# Patient Record
Sex: Male | Born: 1990 | Race: White | Hispanic: No | Marital: Married | Smoking: Never smoker
Health system: Southern US, Community
[De-identification: ages and names within clinical notes are randomized; demographics above are authoritative.]

## PROBLEM LIST (undated history)

## (undated) DIAGNOSIS — F909 Attention-deficit hyperactivity disorder, unspecified type: Secondary | ICD-10-CM

## (undated) DIAGNOSIS — F319 Bipolar disorder, unspecified: Secondary | ICD-10-CM

## (undated) DIAGNOSIS — F419 Anxiety disorder, unspecified: Secondary | ICD-10-CM

## (undated) HISTORY — PX: OTHER SURGICAL HISTORY: SHX169

## (undated) HISTORY — DX: Anxiety disorder, unspecified: F41.9

## (undated) HISTORY — DX: Bipolar disorder, unspecified: F31.9

## (undated) HISTORY — DX: Attention-deficit hyperactivity disorder, unspecified type: F90.9

---
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
## 2014-02-25 ENCOUNTER — Ambulatory Visit (INDEPENDENT_AMBULATORY_CARE_PROVIDER_SITE_OTHER): Payer: Self-pay | Admitting: Emergency Medicine

## 2014-02-25 VITALS — BP 110/68 | HR 58 | Temp 98.6°F | Resp 16 | Ht 69.0 in | Wt 123.0 lb

## 2014-02-25 DIAGNOSIS — Z0289 Encounter for other administrative examinations: Secondary | ICD-10-CM

## 2014-02-25 NOTE — Progress Notes (Signed)
[facility] [address], [place] [address] [phone]  Date:  02/25/2014   Name:  Michael OkaCurtis B Janssens   DOB:  [birth year]   MRN:  [id number]  PCP:  PROVIDER NOT IN SYSTEM    Chief Complaint: Employment Physical   History of Present Illness:  Michael OkaCurtis B Franson is a 23 y.o. very pleasant male patient who presents with the following:  DOT certification  There are no active problems to display for this patient.   No past medical history on file.  No past surgical history on file.  History  Substance Use Topics  . Smoking status: Never Smoker   . Smokeless tobacco: Not on file  . Alcohol Use: Not on file    No family history on file.  No Known Allergies  Medication list has been reviewed and updated.  No current outpatient prescriptions on file prior to visit.   No current facility-administered medications on file prior to visit.    Review of Systems:  As per HPI, otherwise negative.    Physical Examination: Filed Vitals:   02/25/14 1951  BP: 110/68  Pulse: 58  Temp: 98.6 F (37 C)  Resp: 16   Filed Vitals:   02/25/14 1951  Height: 5\' 9"  (1.753 m)  Weight: 123 lb (55.792 kg)   Body mass index is 18.16 kg/(m^2). Ideal Body Weight: Weight in (lb) to have BMI = 25: 168.9  GEN: WDWN, NAD, Non-toxic, A & O x 3 HEENT: Atraumatic, Normocephalic. Neck supple. No masses, No LAD. Ears and Nose: No external deformity. CV: RRR, No M/G/R. No JVD. No thrill. No extra heart sounds. PULM: CTA B, no wheezes, crackles, rhonchi. No retractions. No resp. distress. No accessory muscle use. ABD: S, NT, ND, +BS. No rebound. No HSM. EXTR: No c/c/e NEURO Normal gait.  PSYCH: Normally interactive. Conversant. Not depressed or anxious appearing.  Calm demeanor.     Assessment and Plan: DOT certification  Signed,  Phillips OdorJeffery Anderson, MD

## 2014-05-02 ENCOUNTER — Emergency Department: Payer: Self-pay | Admitting: Emergency Medicine

## 2014-09-17 IMAGING — CR CERVICAL SPINE - COMPLETE 4+ VIEW
1 series · 6 of 6 positions shown · non-contrast
Comparison: None.

CLINICAL DATA: Neck pain.

EXAM:
CERVICAL SPINE  4+ VIEWS

[Series 1: dxr cervical spine complete · 0.14mm/px · 6 of 6 slices shown]
[im 1/6]
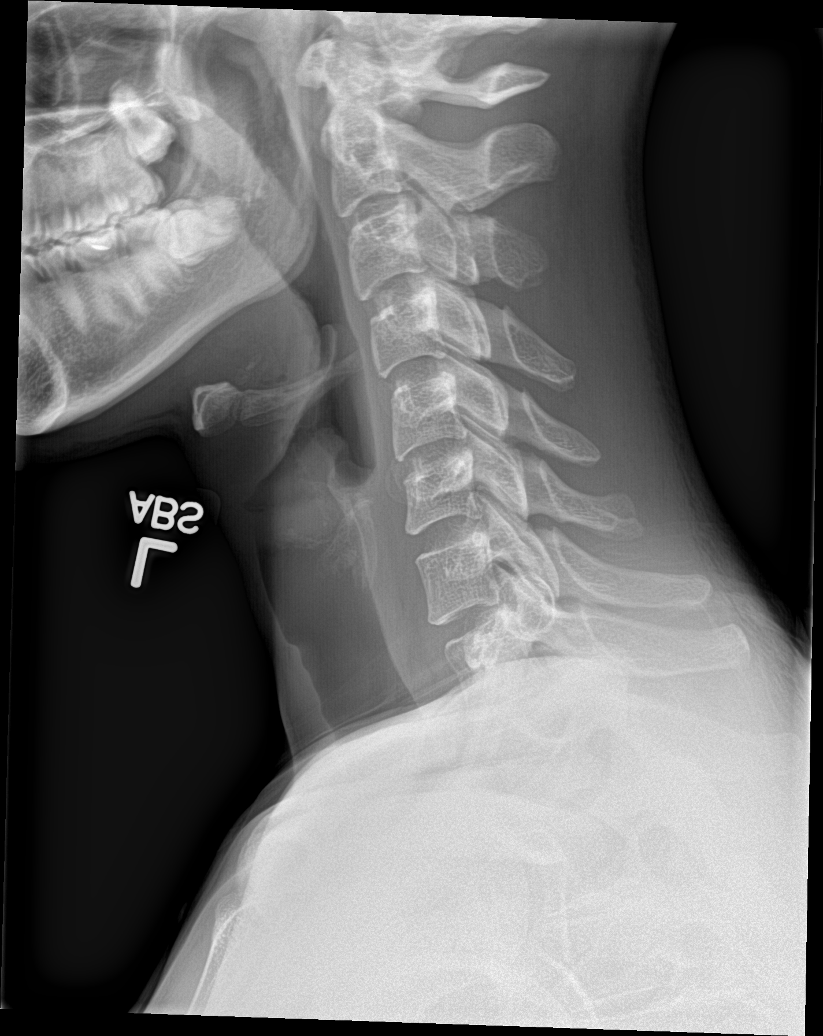
[im 2/6]
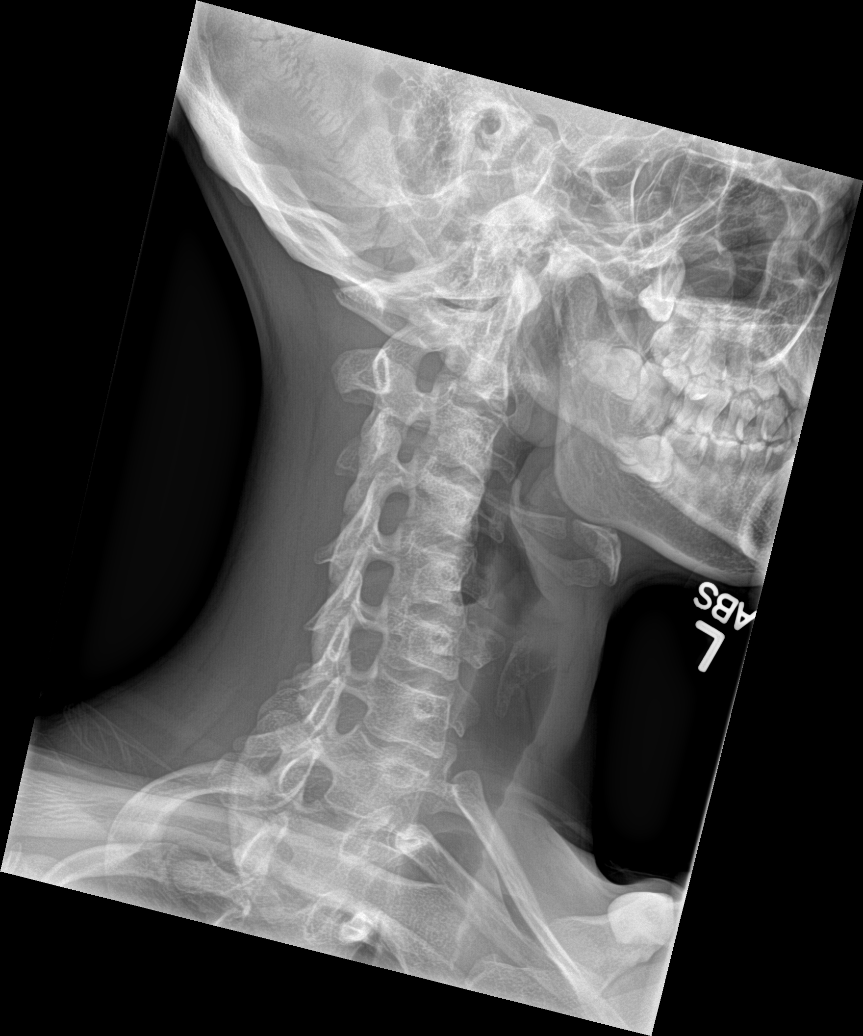
[im 3/6]
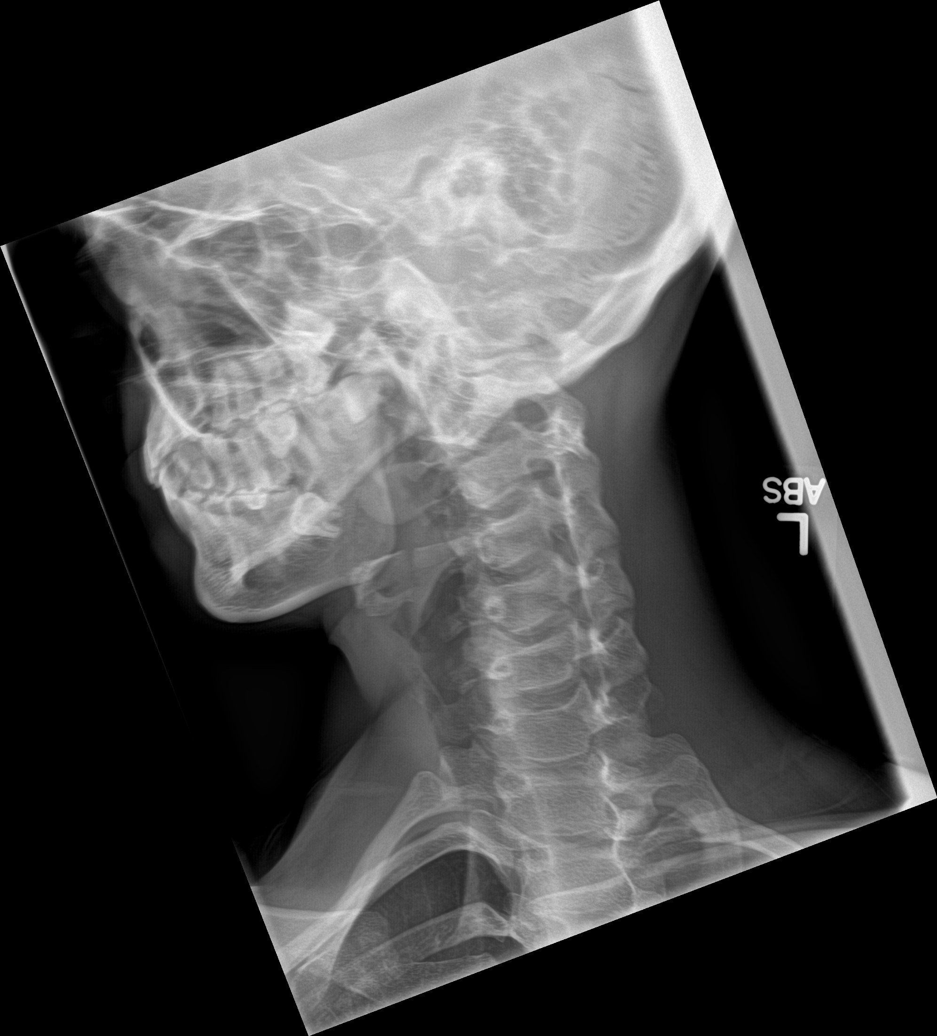
[im 4/6]
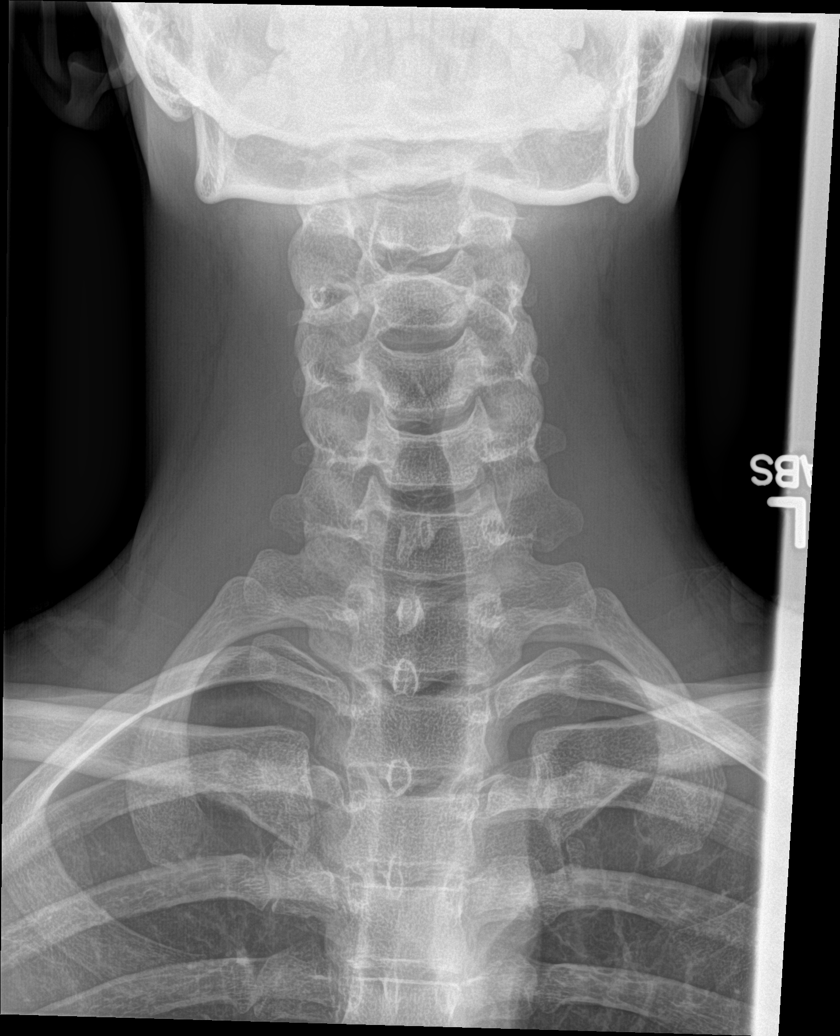
[im 5/6]
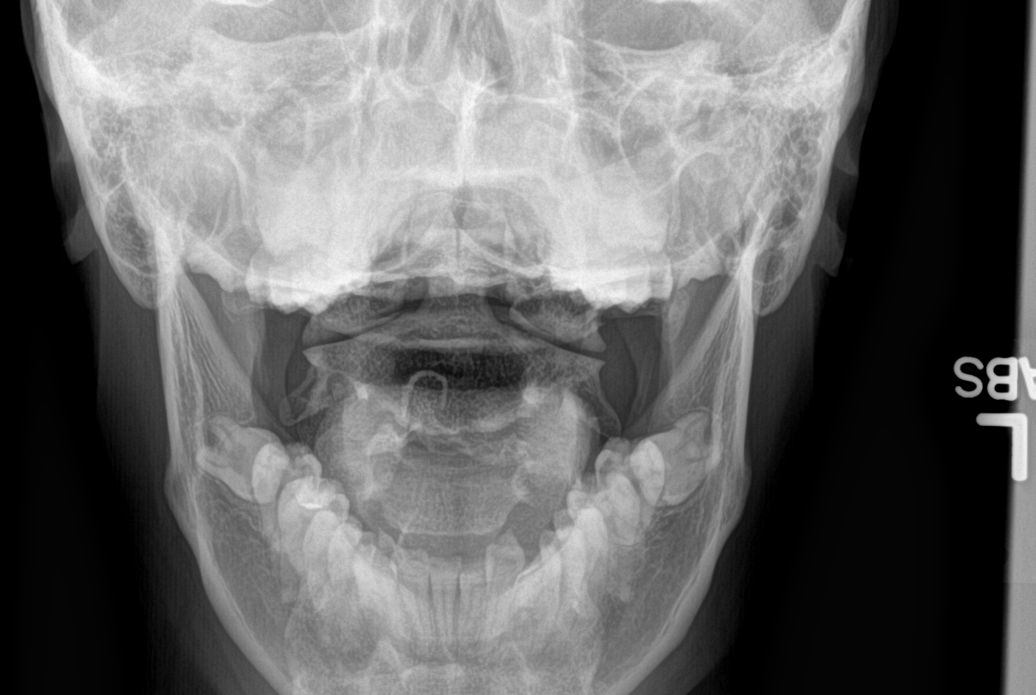
[im 6/6]
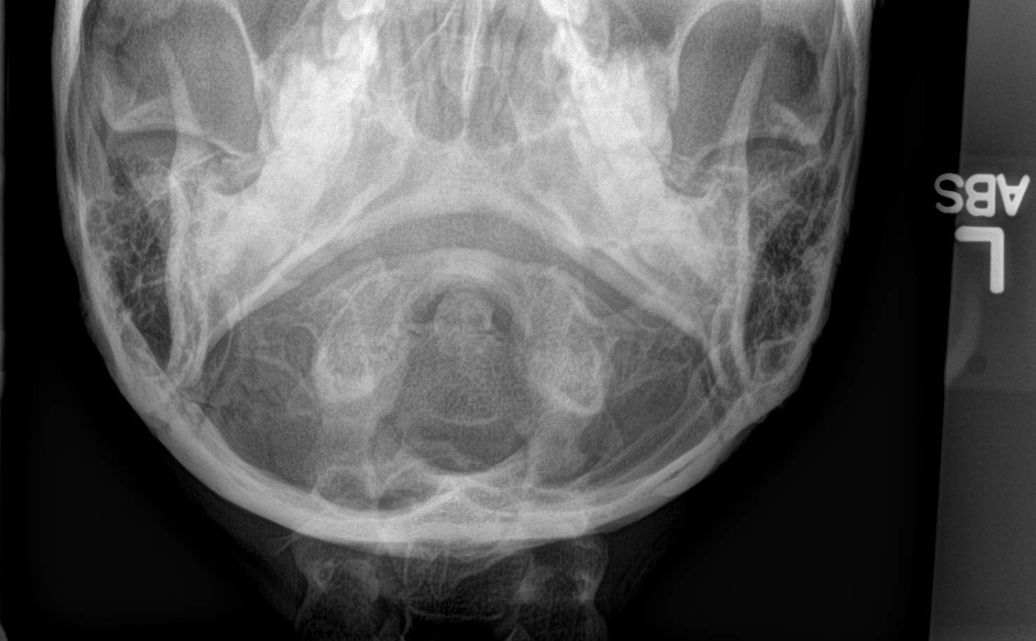

[6 of 6 positions shown; findings below may reference images not displayed]

FINDINGS: There is no evidence of cervical spine fracture or prevertebral soft
tissue swelling. Alignment is normal. No other significant bone
abnormalities are identified.
IMPRESSION: Negative cervical spine radiographs.

## 2019-07-20 ENCOUNTER — Other Ambulatory Visit: Payer: Self-pay

## 2019-07-20 DIAGNOSIS — Z20822 Contact with and (suspected) exposure to covid-19: Secondary | ICD-10-CM

## 2019-07-22 LAB — NOVEL CORONAVIRUS, NAA: SARS-CoV-2, NAA: NOT DETECTED

## 2019-10-01 ENCOUNTER — Ambulatory Visit: Payer: 59 | Attending: Internal Medicine

## 2019-10-01 DIAGNOSIS — Z20822 Contact with and (suspected) exposure to covid-19: Secondary | ICD-10-CM | POA: Insufficient documentation

## 2019-10-02 LAB — NOVEL CORONAVIRUS, NAA: SARS-CoV-2, NAA: NOT DETECTED

## 2019-10-06 DIAGNOSIS — Z20828 Contact with and (suspected) exposure to other viral communicable diseases: Secondary | ICD-10-CM | POA: Diagnosis not present

## 2019-10-06 DIAGNOSIS — Z03818 Encounter for observation for suspected exposure to other biological agents ruled out: Secondary | ICD-10-CM | POA: Diagnosis not present

## 2019-12-17 ENCOUNTER — Ambulatory Visit: Payer: 59 | Admitting: Internal Medicine

## 2019-12-17 ENCOUNTER — Encounter: Payer: Self-pay | Admitting: Internal Medicine

## 2019-12-17 ENCOUNTER — Other Ambulatory Visit: Payer: Self-pay

## 2019-12-17 VITALS — BP 128/62 | HR 77 | Ht 69.0 in | Wt 138.0 lb

## 2019-12-17 DIAGNOSIS — Z0000 Encounter for general adult medical examination without abnormal findings: Secondary | ICD-10-CM

## 2019-12-17 NOTE — Patient Instructions (Signed)

## 2019-12-17 NOTE — Progress Notes (Signed)
Date:  12/17/2019   Name:  Michael Bowen   DOB:  Jan 30, 1991   MRN:  [id number]   Chief Complaint: [place] patient to establish PCP. He has not had a check up in about 10 yrs.  He did have a Tdap in 2015 after a traumatic thumb injury.  He has no concerns today.  Michael Bowen is a 29 y.o. male who presents today for his Complete Annual Exam. He feels well. He reports exercising regularly and has a physical job. He reports he is sleeping well. He is married with one and 71 year old sons.  Immunization History  Administered Date(s) Administered  . Dtap, Unspecified 05/02/2014  . Tdap 04/22/2014    HPI   Review of Systems  Constitutional: Negative for appetite change, chills, diaphoresis, fatigue and unexpected weight change.  HENT: Negative for hearing loss, tinnitus, trouble swallowing and voice change.   Eyes: Negative for visual disturbance.  Respiratory: Negative for choking, shortness of breath and wheezing.   Cardiovascular: Negative for chest pain, palpitations and leg swelling.  Gastrointestinal: Negative for abdominal pain, blood in stool, constipation and diarrhea.  Genitourinary: Negative for difficulty urinating, dysuria and frequency.  Musculoskeletal: Negative for arthralgias, back pain and myalgias.  Skin: Negative for color change and rash (on right index finger).  Neurological: Negative for dizziness, syncope and headaches.  Hematological: Negative for adenopathy.  Psychiatric/Behavioral: Negative for dysphoric mood and sleep disturbance.    There are no problems to display for this patient.   No Known Allergies  Past Surgical History:  Procedure Laterality Date  . none      Social History   Tobacco Use  . Smoking status: Never Smoker  . Smokeless tobacco: Never Used  Substance Use Topics  . Alcohol use: Yes    Comment: occasional  . Drug use: Never     Medication list has been reviewed and updated.  No outpatient medications  have been marked as taking for the 12/17/19 encounter (Office Visit) with Glean Hess, MD.    [facility] 2/9 Scores 12/17/2019  PHQ - 2 Score 0  PHQ- 9 Score 0    BP Readings from Last 3 Encounters:  12/17/19 128/62  02/25/14 110/68    Physical Exam Vitals and nursing note reviewed.  Constitutional:      Appearance: Normal appearance. He is well-developed.  HENT:     Head: Normocephalic.     Right Ear: External ear normal. No decreased hearing noted. There is impacted cerumen.     Left Ear: External ear normal. No decreased hearing noted. There is impacted cerumen.     Nose: Nose normal.     Mouth/Throat:     Pharynx: Uvula midline.  Eyes:     Conjunctiva/sclera: Conjunctivae normal.     Pupils: Pupils are equal, round, and reactive to light.  Neck:     Thyroid: No thyromegaly.     Vascular: No carotid bruit.  Cardiovascular:     Rate and Rhythm: Normal rate and regular rhythm.     Pulses: Normal pulses.     Heart sounds: Normal heart sounds. No murmur.  Pulmonary:     Effort: Pulmonary effort is normal.     Breath sounds: Normal breath sounds. No wheezing.  Chest:     Breasts:        Right: No mass.        Left: No mass.  Abdominal:     General: Abdomen is flat. Bowel  sounds are normal.     Palpations: Abdomen is soft. There is no mass.     Tenderness: There is no abdominal tenderness. There is no right CVA tenderness or left CVA tenderness.     Hernia: No hernia is present.  Musculoskeletal:        General: Normal range of motion.     Cervical back: Normal range of motion and neck supple.     Right lower leg: No edema.     Left lower leg: No edema.  Lymphadenopathy:     Cervical: No cervical adenopathy.  Skin:    General: Skin is warm and dry.     Capillary Refill: Capillary refill takes less than 2 seconds.     Comments: Tiny healing vesicles on right index finger  Neurological:     General: No focal deficit present.     Mental Status: He is alert and  oriented to person, place, and time.     Deep Tendon Reflexes: Reflexes are normal and symmetric.  Psychiatric:        Attention and Perception: Attention normal.        Mood and Affect: Mood normal.        Speech: Speech normal.        Behavior: Behavior normal.     Wt Readings from Last 3 Encounters:  12/17/19 138 lb (62.6 kg)  02/25/14 123 lb (55.8 kg)    BP 128/62   Pulse 77   Ht 5\' 9"  (1.753 m)   Wt 138 lb (62.6 kg)   SpO2 100%   BMI 20.38 kg/m   Assessment and Plan: 1. Annual physical exam Normal exam Continue healthy diet, regular exercise Pt declines labs and vaccines today due to fear of needles  Partially dictated using Dragon software. Any errors are unintentional.  , MD [facility]  12/17/2019

## 2020-08-29 ENCOUNTER — Ambulatory Visit
Admission: RE | Admit: 2020-08-29 | Discharge: 2020-08-29 | Disposition: A | Discharge status: Home / Self Care | Payer: 59 | Source: Ambulatory Visit | Attending: Sports Medicine | Admitting: Sports Medicine

## 2020-08-29 ENCOUNTER — Ambulatory Visit (INDEPENDENT_AMBULATORY_CARE_PROVIDER_SITE_OTHER): Payer: 59

## 2020-08-29 ENCOUNTER — Other Ambulatory Visit: Payer: Self-pay

## 2020-08-29 VITALS — BP 109/68 | HR 79 | Temp 98.2°F | Resp 18 | Ht 69.0 in | Wt 145.0 lb

## 2020-08-29 DIAGNOSIS — R229 Localized swelling, mass and lump, unspecified: Secondary | ICD-10-CM

## 2020-08-29 DIAGNOSIS — R509 Fever, unspecified: Secondary | ICD-10-CM

## 2020-08-29 DIAGNOSIS — M7989 Other specified soft tissue disorders: Secondary | ICD-10-CM

## 2020-08-29 DIAGNOSIS — R591 Generalized enlarged lymph nodes: Secondary | ICD-10-CM

## 2020-08-29 DIAGNOSIS — M79622 Pain in left upper arm: Secondary | ICD-10-CM

## 2020-08-29 LAB — COMPREHENSIVE METABOLIC PANEL
ALT: 19 U/L (ref 0–44)
AST: 17 U/L (ref 15–41)
Albumin: 4.3 g/dL (ref 3.5–5.0)
Alkaline Phosphatase: 74 U/L (ref 38–126)
Anion gap: 8 (ref 5–15)
BUN: 16 mg/dL (ref 6–20)
CO2: 27 mmol/L (ref 22–32)
Calcium: 9.2 mg/dL (ref 8.9–10.3)
Chloride: 106 mmol/L (ref 98–111)
Creatinine, Ser: 0.91 mg/dL (ref 0.61–1.24)
GFR, Estimated: >60 mL/min (ref >60)
Glucose, Bld: 83 mg/dL (ref 70–99)
Potassium: 4.3 mmol/L (ref 3.5–5.1)
Sodium: 141 mmol/L (ref 135–145)
Total Bilirubin: 0.8 mg/dL (ref 0.3–1.2)
Total Protein: 8.5 g/dL — ABNORMAL HIGH (ref 6.5–8.1)

## 2020-08-29 LAB — CBC WITH DIFFERENTIAL/PLATELET
Abs Immature Granulocytes: 0.02 10*3/uL (ref 0.00–0.07)
Basophils Absolute: 0 10*3/uL (ref 0.0–0.1)
Basophils Relative: 1 %
Eosinophils Absolute: 0.1 10*3/uL (ref 0.0–0.5)
Eosinophils Relative: 2 %
HCT: 44.2 % (ref 39.0–52.0)
Hemoglobin: 14.7 g/dL (ref 13.0–17.0)
Immature Granulocytes: 0 %
Lymphocytes Relative: 31 %
Lymphs Abs: 1.9 10*3/uL (ref 0.7–4.0)
MCH: 30.1 pg (ref 26.0–34.0)
MCHC: 33.3 g/dL (ref 30.0–36.0)
MCV: 90.4 fL (ref 80.0–100.0)
Monocytes Absolute: 0.6 10*3/uL (ref 0.1–1.0)
Monocytes Relative: 11 %
Neutro Abs: 3.3 10*3/uL (ref 1.7–7.7)
Neutrophils Relative %: 55 %
Platelets: 36 10*3/uL — ABNORMAL LOW (ref 150–400)
RBC: 4.89 MIL/uL (ref 4.22–5.81)
RDW: 11.7 % (ref 11.5–15.5)
WBC: 6 10*3/uL (ref 4.0–10.5)
nRBC: 0 % (ref 0.0–0.2)

## 2020-08-29 MED ORDER — TUBERCULIN PPD 5 UNIT/0.1ML ID SOLN
5.0000 [IU] | Freq: Once | INTRADERMAL | Status: DC
  Administered 2020-08-29: 5 [IU] via INTRADERMAL

## 2020-08-29 NOTE — ED Triage Notes (Signed)
Patient in today c/o lump in his left arm pit x 1 month. Patient states area is painful, but doesn't have a discharge. Patient has had fever (99-100) x 2-3 days. Patient's last dose of Ibuprofen was yesterday.

## 2020-08-29 NOTE — ED Triage Notes (Signed)
PPD placed left lower forearm. Patient advised to RTC 48-72 hours to have read. Patient verbalized understanding.

## 2020-08-29 NOTE — Discharge Instructions (Addendum)
The labs that were drawn and returned today are reassuring.  There are still several labs that we will be sent to the hospital.  Please make a follow-up appointment with your primary care physician to discuss these results.  In addition, your primary care physician may order a CT scan of your chest to include your left axilla to further delineate your axillary mass.

## 2020-08-29 NOTE — ED Provider Notes (Signed)
[facility]    CSN: [id number] Arrival date & time: 08/29/20  0846      History   Chief Complaint Chief Complaint  Patient presents with  . Appointment  . Mass    Left axilla    HPI Michael Bowen is a 29 y.o. male.   Patient is a pleasant 29 year old male who presents for evaluation of a lump in his left axilla.  He has noticed it for about a month.  It gets bigger on occasion.  No rhyme or reason.  Concerning because his mom is being treated for breast cancer.  It is tender to palpation.  He says that the lump is not all that big today.  There is times where it does get bigger and is more painful.  Denies any fever shakes or chills.  No abdominal pain or chest pain.  No shortness of breath.  He has not noticed any masses anywhere else.  No masses in his neck.  He is right-hand dominant.  He has not done any recent travel.  He is not taking any medications.  He does have bipolar disease but is not on any meds at the present time.  He works at [employer].  No significant exposure.  He does have a cat at home but is not around the cat.  No recent scratches.  He does not change the litter box.  No red flag signs or symptoms.  No high risk behavior, no concern for HIV or any other sexually transmitted diseases.      Past Medical History:  Diagnosis Date  . ADHD   . Bipolar 1 disorder (HCC)     There are no problems to display for this patient.   Past Surgical History:  Procedure Laterality Date  . none         Home Medications    Prior to Admission medications   Not on File    Family History Family History  Problem Relation Age of Onset  . Breast cancer Mother   . COPD Mother   . Bipolar disorder Father   . Diabetes Maternal Grandmother   . Diabetes Maternal Grandfather   . Stroke Paternal Grandfather   . Heart disease Paternal Grandfather   . Autism Nephew     Social History Social History   Tobacco Use  . Smoking status: Never Smoker   . Smokeless tobacco: Never Used  Vaping Use  . Vaping Use: Never used  Substance Use Topics  . Alcohol use: Yes    Comment: occasional  . Drug use: Never     Allergies   Patient has no known allergies.   Review of Systems Review of Systems  Constitutional: Negative for activity change, appetite change, chills, fatigue and fever.  HENT:       Positive for small lump in the left axilla that is mildly tender.  Respiratory: Negative for cough, chest tightness, shortness of breath and stridor.   Cardiovascular: Negative for chest pain.  Gastrointestinal: Negative for abdominal pain.  Genitourinary: Negative for dysuria and flank pain.  Skin: Negative for color change, pallor, rash and wound.  Neurological: Negative for dizziness, light-headedness, numbness and headaches.  Psychiatric/Behavioral: Negative for agitation, behavioral problems and hallucinations.  All other systems reviewed and are negative.    Physical Exam Triage Vital Signs ED Triage Vitals  Enc Vitals Group     BP 08/29/20 0859 109/68     Pulse Rate 08/29/20 0859 79  Resp 08/29/20 0859 18     Temp 08/29/20 0859 98.2 F (36.8 C)     Temp Source 08/29/20 0859 Oral     SpO2 08/29/20 0859 100 %     Weight 08/29/20 0859 145 lb (65.8 kg)     Height 08/29/20 0859 5\' 9"  (1.753 m)     Head Circumference --      Peak Flow --      Pain Score 08/29/20 0858 2     Pain Loc --      Pain Edu? --      Excl. in GC? --    No data found.  Updated Vital Signs BP 109/68 (BP Location: Left Arm)   Pulse 79   Temp 98.2 F (36.8 C) (Oral)   Resp 18   Ht 5\' 9"  (1.753 m)   Wt 65.8 kg   SpO2 100%   BMI 21.41 kg/m   Visual Acuity Right Eye Distance:   Left Eye Distance:   Bilateral Distance:    Right Eye Near:   Left Eye Near:    Bilateral Near:     Physical Exam Vitals and nursing note reviewed.  Constitutional:      General: He is not in acute distress.    Appearance: Normal appearance. He is not  ill-appearing or toxic-appearing.  HENT:     Head: Normocephalic and atraumatic.  Neck:     Comments: Small lymph node noted that is mildly tender in the left axilla.  No evidence of an abscess.  No erythema.  No open wound.  No fluctuance. Cardiovascular:     Rate and Rhythm: Normal rate and regular rhythm.     Pulses: Normal pulses.     Heart sounds: Normal heart sounds. No murmur heard. No friction rub. No gallop.   Pulmonary:     Effort: Pulmonary effort is normal. No respiratory distress.     Breath sounds: Normal breath sounds. No stridor. No wheezing, rhonchi or rales.  Abdominal:     General: Abdomen is flat. Bowel sounds are normal.     Palpations: Abdomen is soft. There is no mass.     Tenderness: There is no guarding or rebound.  Musculoskeletal:     Cervical back: Normal range of motion and neck supple. No tenderness.  Lymphadenopathy:     Cervical: No cervical adenopathy.  Skin:    General: Skin is warm and dry.     Capillary Refill: Capillary refill takes less than 2 seconds.  Neurological:     General: No focal deficit present.     Mental Status: He is alert.      UC Treatments / Results  Labs (all labs ordered are listed, but only abnormal results are displayed) Labs Reviewed  CBC WITH DIFFERENTIAL/PLATELET - Abnormal; Notable for the following components:      Result Value   Platelets 36 (*)    All other components within normal limits  COMPREHENSIVE METABOLIC PANEL - Abnormal; Notable for the following components:   Total Protein 8.5 (*)    All other components within normal limits  CMV IGM  TOXOPLASMA GONDII ANTIBODY, IGM  EPSTEIN-BARR VIRUS (EBV) ANTIBODY PROFILE    EKG   Radiology DG Chest 2 View  Result Date: 08/29/2020 CLINICAL DATA:  Fever.  Left axillary mass. EXAM: CHEST - 2 VIEW COMPARISON:  None. FINDINGS: Patient rotated minimally left. Midline trachea. Normal heart size and mediastinal contours. Sharp costophrenic angles. No  pneumothorax. Clear lungs. Minimal S shaped thoracic  spine curvature. IMPRESSION: No active cardiopulmonary disease. Electronically Signed   By: Jeronimo Greaves M.D.   On: 08/29/2020 10:40    Procedures Procedures (including critical care time)  Medications Ordered in UC Medications - No data to display  Initial Impression / Assessment and Plan / UC Course  I have reviewed the triage vital signs and the nursing notes.  Pertinent labs & imaging results that were available during my care of the patient were reviewed by me and considered in my medical decision making (see chart for details).  Clinical Course as of 08/31/20 0810  Fri Aug 29, 2020  1144 Abs Immature Granulocytes: 0.02 [KB]    Clinical Course User Index [KB] Delton See, MD   Clinical impression: Small lymph node in the left axilla that comes and goes.  Today it is very small and mildly tender.  Will work-up accordingly.  Treatment plan: 1.  The findings and treatment plan were discussed in detail with the patient.  Patient was in agreement. 2.  We will obtain some labs today.  On discharge the labs that were available were reassuring.  We will contact the patient if any of the labs that will take several days come back positive. 3.  He may need to chest CT to include the axilla with and without contrast.  Depending on the lab results may order that or have him follow-up with his primary care provider and if it is justified have them order and follow him. 4.  No interventions today, just watch the swelling closely and if there is any change in his clinical presentation he knows to seek out immediate medical attention.  We will transfer care to his primary care physician at this time. 5.  Discharge from care today.   Final Clinical Impressions(s) / UC Diagnoses   Final diagnoses:  Lymphadenopathy  Pain in axilla, left  Left axillary swelling     Discharge Instructions     The labs that were drawn and returned  today are reassuring.  There are still several labs that we will be sent to the hospital.  Please make a follow-up appointment with your primary care physician to discuss these results.  In addition, your primary care physician may order a CT scan of your chest to include your left axilla to further delineate your axillary mass.    ED Prescriptions    None     PDMP not reviewed this encounter.   Delton See, MD 08/31/20 [phone]

## 2020-08-31 LAB — TOXOPLASMA GONDII ANTIBODY, IGM: Toxoplasma Antibody- IgM: <3 AU/mL (ref 0.0–7.9)

## 2020-08-31 LAB — CMV IGM: CMV IgM: <30 AU/mL (ref 0.0–29.9)

## 2020-08-31 LAB — EPSTEIN-BARR VIRUS (EBV) ANTIBODY PROFILE
EBV NA IgG: <18 U/mL (ref 0.0–17.9)
EBV VCA IgG: 50.7 U/mL — ABNORMAL HIGH (ref 0.0–17.9)
EBV VCA IgM: <36 U/mL (ref 0.0–35.9)

## 2020-08-31 LAB — INFECT DISEASE AB IGM REFLEX 1

## 2020-09-01 ENCOUNTER — Other Ambulatory Visit: Payer: Self-pay

## 2020-09-01 ENCOUNTER — Ambulatory Visit
Admission: EM | Admit: 2020-09-01 | Discharge: 2020-09-01 | Disposition: A | Discharge status: Home / Self Care | Payer: 59 | Attending: Family Medicine | Admitting: Family Medicine

## 2020-09-01 DIAGNOSIS — R7611 Nonspecific reaction to tuberculin skin test without active tuberculosis: Secondary | ICD-10-CM | POA: Diagnosis not present

## 2020-09-01 DIAGNOSIS — D696 Thrombocytopenia, unspecified: Secondary | ICD-10-CM | POA: Diagnosis not present

## 2020-09-01 DIAGNOSIS — R590 Localized enlarged lymph nodes: Secondary | ICD-10-CM | POA: Insufficient documentation

## 2020-09-01 NOTE — Discharge Instructions (Signed)
I have placed a referral.  We will call with the TB Gold test results.  Take care  Dr. Adriana Simas

## 2020-09-01 NOTE — ED Triage Notes (Signed)
Pt here for TB skin test interpretation/read, had TB skin p. Pt reports lump under left axilla that he was evaluated for 3 days ago has improved slightly. Report mild non-productive cough that pt attributes to "allergies".   Denies fever, chills, n/v, night sweats.  Area of mild erythema and induration at TB skin test site 79mm x 10 mm. Dr. Adriana Simas advised of pt status/arrival and lab results. Dr. Adriana Simas to evaluate pt.

## 2020-09-01 NOTE — ED Provider Notes (Signed)
[facility]    CSN: [id number] Arrival date & time: 09/01/20  0857      History   Chief Complaint Chief Complaint  Patient presents with  . TB test read   HPI  29 year old male presents for evaluation the above.  Patient recently seen here on Christmas Eve.  Had an ongoing lump in the axilla.  Laboratory studies were obtained and revealed marked thrombocytopenia at 36.  Epstein-Barr titer was elevated.  Toxoplasma antibody negative.  Chest x-ray negative.  Patient is here today for TB skin test read.  The area is raised and erythematous.  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  He does note cough but attributes this to allergies.  He states that the area in his left axilla is still there.  He is concerned about this potentially being cancerous.  Denies fever, chills, night sweats.   Past Medical History:  Diagnosis Date  . ADHD   . Bipolar 1 disorder [facility])    Past Surgical History:  Procedure Laterality Date  . none     Home Medications    Prior to Admission medications   Not on File    Family History Family History  Problem Relation Age of Onset  . Breast cancer Mother   . COPD Mother   . Bipolar disorder Father   . Diabetes Maternal Grandmother   . Diabetes Maternal Grandfather   . Stroke Paternal Grandfather   . Heart disease Paternal Grandfather   . Autism Nephew     Social History Social History   Tobacco Use  . Smoking status: Never Smoker  . Smokeless tobacco: Never Used  Vaping Use  . Vaping Use: Never used  Substance Use Topics  . Alcohol use: Yes    Comment: occasional  . Drug use: Never     Allergies   Patient has no known allergies.   Review of Systems Review of Systems Per HPI  Physical Exam Triage Vital Signs ED Triage Vitals  Enc Vitals Group     BP 09/01/20 0942 117/73     Pulse Rate 09/01/20  0942 99     Resp 09/01/20 0942 17     Temp 09/01/20 0942 98.2 F (36.8 C)     Temp Source 09/01/20 0942 Oral     SpO2 09/01/20 0942 97 %     Weight --      Height --      Head Circumference --      Peak Flow --      Pain Score 09/01/20 0940 0     Pain Loc --      Pain Edu? --      Excl. in GC? --    Updated Vital Signs BP 117/73 (BP Location: Left Arm)   Pulse 99   Temp 98.2 F (36.8 C) (Oral)   Resp 17   SpO2 97%   Visual Acuity Right Eye Distance:   Left Eye Distance:   Bilateral Distance:    Right Eye Near:   Left Eye Near:    Bilateral Near:     Physical Exam Vitals and nursing note reviewed.  Constitutional:      General: He is not in acute distress.    Appearance: Normal appearance. He is not ill-appearing.  HENT:     Head: Normocephalic and atraumatic.  Eyes:     General:  Right eye: No discharge.        Left eye: No discharge.     Conjunctiva/sclera: Conjunctivae normal.  Pulmonary:     Effort: Pulmonary effort is normal. No respiratory distress.  Lymphadenopathy:     Comments: Left axilla with a palpable firm area, suspect lymphadenopathy.  Neurological:     Mental Status: He is alert.  Psychiatric:        Mood and Affect: Mood normal.        Behavior: Behavior normal.    UC Treatments / Results  Labs (all labs ordered are listed, but only abnormal results are displayed) Labs Reviewed  QUANTIFERON-TB GOLD PLUS    EKG   Radiology No results found.  Procedures Procedures (including critical care time)  Medications Ordered in UC Medications - No data to display  Initial Impression / Assessment and Plan / UC Course  I have reviewed the triage vital signs and the nursing notes.  Pertinent labs & imaging results that were available during my care of the patient were reviewed by me and considered in my medical decision making (see chart for details).    29 year old male presents for follow up regarding recent PPD test as well as  recent visit here.  Patient has thrombocytopenia, positive PPD, and ongoing axillary mass (suspected adenopathy).  I discussed his case with infectious disease at [facility], [facility].  She recommended quantified on gold testing.  He has no risk factors for TB.  He has had a negative chest x-ray.  I do not believe that he has TB.  However given the positive TB skin test, this warrants further evaluation.  Given his thrombocytopenia and suspected axillary lymphadenopathy, I am placing referral to heme-onc.  Etiology uncertain at this time.  Final Clinical Impressions(s) / UC Diagnoses   Final diagnoses:  Axillary lymphadenopathy  Thrombocytopenia (HCC)  Positive PPD     Discharge Instructions     I have placed a referral.  We will call with the TB Gold test results.  Take care  Dr. Adriana Simas    ED Prescriptions    None     PDMP not reviewed this encounter.   Tommie Sams, [place] 09/01/20 1116

## 2020-09-03 LAB — QUANTIFERON-TB GOLD PLUS (RQFGPL)
QuantiFERON Mitogen Value: >10 IU/mL
QuantiFERON Nil Value: 0.11 IU/mL
QuantiFERON TB1 Ag Value: 0.19 IU/mL
QuantiFERON TB2 Ag Value: 0.18 IU/mL

## 2020-09-03 LAB — QUANTIFERON-TB GOLD PLUS: QuantiFERON-TB Gold Plus: NEGATIVE

## 2020-09-07 DIAGNOSIS — D696 Thrombocytopenia, unspecified: Secondary | ICD-10-CM | POA: Insufficient documentation

## 2020-09-07 HISTORY — DX: Thrombocytopenia, unspecified: D69.6

## 2020-09-07 NOTE — Progress Notes (Signed)
[facility]  Telephone:(336) [phone] Fax:(336) [phone]  ID: Michael Bowen OB: 06-May-1991  MR#: [id number]  UYQ#:[id number]  Patient Care Team: Reubin Milan, MD as PCP - General (Internal Medicine)  CHIEF COMPLAINT: Thrombocytopenia, axillary lymphadenopathy.  INTERVAL HISTORY: Patient is a 30 year old male who recently presented to the emergency room with left axillary lymphadenopathy.  He was also noted to have a platelet count of 36.  Patient reports he had his Covid vaccine 1 to 2 weeks prior to noting his lymphadenopathy.  He currently feels well and is asymptomatic.  He denies any easy bleeding or bruising.  He has a good appetite and denies weight loss.  He denies any recent fevers or illnesses.  He has no chest pain, shortness of breath, cough, or hemoptysis.  He denies any nausea, vomiting, constipation, or diarrhea.  He has no urinary complaints.  Patient feels at his baseline offers no specific complaints today.  REVIEW OF SYSTEMS:   Review of Systems  Constitutional: Negative.  Negative for fever, malaise/fatigue and weight loss.  Respiratory: Negative.  Negative for cough, hemoptysis and shortness of breath.   Cardiovascular: Negative.  Negative for chest pain and leg swelling.  Gastrointestinal: Negative.  Negative for abdominal pain and heartburn.  Genitourinary: Negative.  Negative for dysuria.  Musculoskeletal: Negative.  Negative for back pain.  Skin: Negative.  Negative for rash.  Neurological: Negative.  Negative for dizziness, focal weakness, weakness and headaches.  Endo/Heme/Allergies: Does not bruise/bleed easily.  Psychiatric/Behavioral: Negative.  The patient is not nervous/anxious.     As per HPI. Otherwise, a complete review of systems is negative.  PAST MEDICAL HISTORY: Past Medical History:  Diagnosis Date  . ADHD   . Anxiety   . Bipolar 1 disorder (HCC)     PAST SURGICAL HISTORY: Past Surgical History:  Procedure  Laterality Date  . none      FAMILY HISTORY: Family History  Problem Relation Age of Onset  . Breast cancer Mother   . COPD Mother   . Bipolar disorder Father   . Diabetes Maternal Grandmother   . Diabetes Maternal Grandfather   . Stroke Paternal Grandfather   . Heart disease Paternal Grandfather   . Autism Nephew     ADVANCED DIRECTIVES (Y/N):  N  HEALTH MAINTENANCE: Social History   Tobacco Use  . Smoking status: Never Smoker  . Smokeless tobacco: Never Used  Vaping Use  . Vaping Use: Never used  Substance Use Topics  . Alcohol use: Yes    Comment: occasional  . Drug use: Never     Colonoscopy:  PAP:  Bone density:  Lipid panel:  No Known Allergies  No current outpatient medications on file.   No current facility-administered medications for this visit.    OBJECTIVE: Vitals:   09/09/20 1506  BP: 126/80  Pulse: 90  Resp: 18  Temp: 98 F (36.7 C)  SpO2: 100%     Body mass index is 20.53 kg/m.    ECOG FS:0 - Asymptomatic  General: Well-developed, well-nourished, no acute distress. Eyes: Pink conjunctiva, anicteric sclera. HEENT: Normocephalic, moist mucous membranes. Lungs: No audible wheezing or coughing. Heart: Regular rate and rhythm. Abdomen: Soft, nontender, no obvious distention. Musculoskeletal: No edema, cyanosis, or clubbing. Neuro: Alert, answering all questions appropriately. Cranial nerves grossly intact. Skin: No rashes or petechiae noted. Psych: Normal affect. Lymphatics: No cervical, calvicular, axillary or inguinal LAD.   LAB RESULTS:  Lab Results  Component Value Date   NA 141 08/29/2020  K 4.3 08/29/2020   CL 106 08/29/2020   CO2 27 08/29/2020   GLUCOSE 83 08/29/2020   BUN 16 08/29/2020   CREATININE 0.91 08/29/2020   CALCIUM 9.2 08/29/2020   PROT 8.5 (H) 08/29/2020   ALBUMIN 4.3 08/29/2020   AST 17 08/29/2020   ALT 19 08/29/2020   ALKPHOS 74 08/29/2020   BILITOT 0.8 08/29/2020   GFRNONAA >60 08/29/2020    Lab  Results  Component Value Date   WBC 7.3 09/09/2020   NEUTROABS 3.3 08/29/2020   HGB 15.2 09/09/2020   HCT 43.7 09/09/2020   MCV 88.3 09/09/2020   PLT 158 09/09/2020     STUDIES: DG Chest 2 View  Result Date: 08/29/2020 CLINICAL DATA:  Fever.  Left axillary mass. EXAM: CHEST - 2 VIEW COMPARISON:  None. FINDINGS: Patient rotated minimally left. Midline trachea. Normal heart size and mediastinal contours. Sharp costophrenic angles. No pneumothorax. Clear lungs. Minimal S shaped thoracic spine curvature. IMPRESSION: No active cardiopulmonary disease. Electronically Signed   By: Jeronimo Greaves M.D.   On: 08/29/2020 10:40    ASSESSMENT: Thrombocytopenia, left axillary lymphadenopathy.  PLAN:   1. Thrombocytopenia: Resolved.  Patient's platelet count is now within normal limits at 158.  Unclear etiology of his transient drop.  All of his other laboratory work including iron stores, folate, B12 are also within normal limits.  Platelet antibody profile and ANA were drawn for completeness and are pending at time of dictation.  No intervention is needed.  Patient will have video assisted telemedicine visit in 3 weeks to discuss the results. 2.  Left axillary lymphadenopathy: Patient has no palpable lymphadenopathy on exam.  Possibly transient related to his Covid vaccine.  Will get a chest CT with contrast for completeness to assess for additional lymphadenopathy.  Return to clinic as above to discuss the results.  Patient expressed understanding and was in agreement with this plan. He also understands that He can call clinic at any time with any questions, concerns, or complaints.    Jeralyn Ruths, MD   09/10/2020 7:03 AM

## 2020-09-09 ENCOUNTER — Inpatient Hospital Stay: Payer: 59 | Attending: Oncology | Admitting: Oncology

## 2020-09-09 ENCOUNTER — Encounter: Payer: Self-pay | Admitting: Oncology

## 2020-09-09 ENCOUNTER — Inpatient Hospital Stay: Payer: 59

## 2020-09-09 DIAGNOSIS — Z803 Family history of malignant neoplasm of breast: Secondary | ICD-10-CM | POA: Diagnosis not present

## 2020-09-09 DIAGNOSIS — D696 Thrombocytopenia, unspecified: Secondary | ICD-10-CM

## 2020-09-09 DIAGNOSIS — R590 Localized enlarged lymph nodes: Secondary | ICD-10-CM | POA: Diagnosis not present

## 2020-09-09 LAB — CBC
HCT: 43.7 % (ref 39.0–52.0)
Hemoglobin: 15.2 g/dL (ref 13.0–17.0)
MCH: 30.7 pg (ref 26.0–34.0)
MCHC: 34.8 g/dL (ref 30.0–36.0)
MCV: 88.3 fL (ref 80.0–100.0)
Platelets: 158 10*3/uL (ref 150–400)
RBC: 4.95 MIL/uL (ref 4.22–5.81)
RDW: 11.4 % — ABNORMAL LOW (ref 11.5–15.5)
WBC: 7.3 10*3/uL (ref 4.0–10.5)
nRBC: 0 % (ref 0.0–0.2)

## 2020-09-09 LAB — FERRITIN: Ferritin: 116 ng/mL (ref 24–336)

## 2020-09-09 LAB — VITAMIN B12: Vitamin B-12: 567 pg/mL (ref 180–914)

## 2020-09-09 LAB — FOLATE: Folate: 18.3 ng/mL (ref >5.9)

## 2020-09-09 LAB — IRON AND TIBC
Iron: 99 ug/dL (ref 45–182)
Saturation Ratios: 32 % (ref 17.9–39.5)
TIBC: 307 ug/dL (ref 250–450)
UIBC: 208 ug/dL

## 2020-09-09 LAB — LACTATE DEHYDROGENASE: LDH: 126 U/L (ref 98–192)

## 2020-09-10 LAB — PROTEIN ELECTROPHORESIS, SERUM
A/G Ratio: 1.2 (ref 0.7–1.7)
Albumin ELP: 4.3 g/dL (ref 2.9–4.4)
Alpha-1-Globulin: 0.2 g/dL (ref 0.0–0.4)
Alpha-2-Globulin: 0.6 g/dL (ref 0.4–1.0)
Beta Globulin: 1.2 g/dL (ref 0.7–1.3)
Gamma Globulin: 1.4 g/dL (ref 0.4–1.8)
Globulin, Total: 3.5 g/dL (ref 2.2–3.9)
Total Protein ELP: 7.8 g/dL (ref 6.0–8.5)

## 2020-09-10 LAB — ANA W/REFLEX: Anti Nuclear Antibody (ANA): NEGATIVE

## 2020-09-13 LAB — PLATELET ANTIBODY PROFILE
Glycoprotein IV Antibody: NEGATIVE
HLA Ab Ser Ql EIA: NEGATIVE
IA/IIA Antibody: NEGATIVE
IB/IX Antibody: NEGATIVE
IIB/IIIA Antibody: NEGATIVE

## 2020-09-20 NOTE — Progress Notes (Signed)
  [facility]  Telephone:([phone] [phone]  ID: Michael Bowen OB: 14-Dec-1990  MR#: [id number]  CZY#:[id number]  Patient Care Team: Reubin Milan, MD as PCP - General (Internal Medicine)    Jeralyn Ruths, MD   09/27/2020 1:47 PM     This encounter was created in error - please disregard.

## 2020-09-23 ENCOUNTER — Ambulatory Visit: Payer: 59

## 2020-09-24 ENCOUNTER — Ambulatory Visit: Payer: 59

## 2020-09-25 ENCOUNTER — Inpatient Hospital Stay: Payer: 59 | Admitting: Oncology

## 2020-09-25 DIAGNOSIS — D696 Thrombocytopenia, unspecified: Secondary | ICD-10-CM

## 2020-12-18 ENCOUNTER — Encounter: Payer: 59 | Admitting: Internal Medicine

## 2021-01-14 IMAGING — CR DG CHEST 2V
2 series · 2 of 2 positions shown · non-contrast
Comparison: None.

CLINICAL DATA: Fever.  Left axillary mass.

EXAM:
CHEST - 2 VIEW

[chest pa]
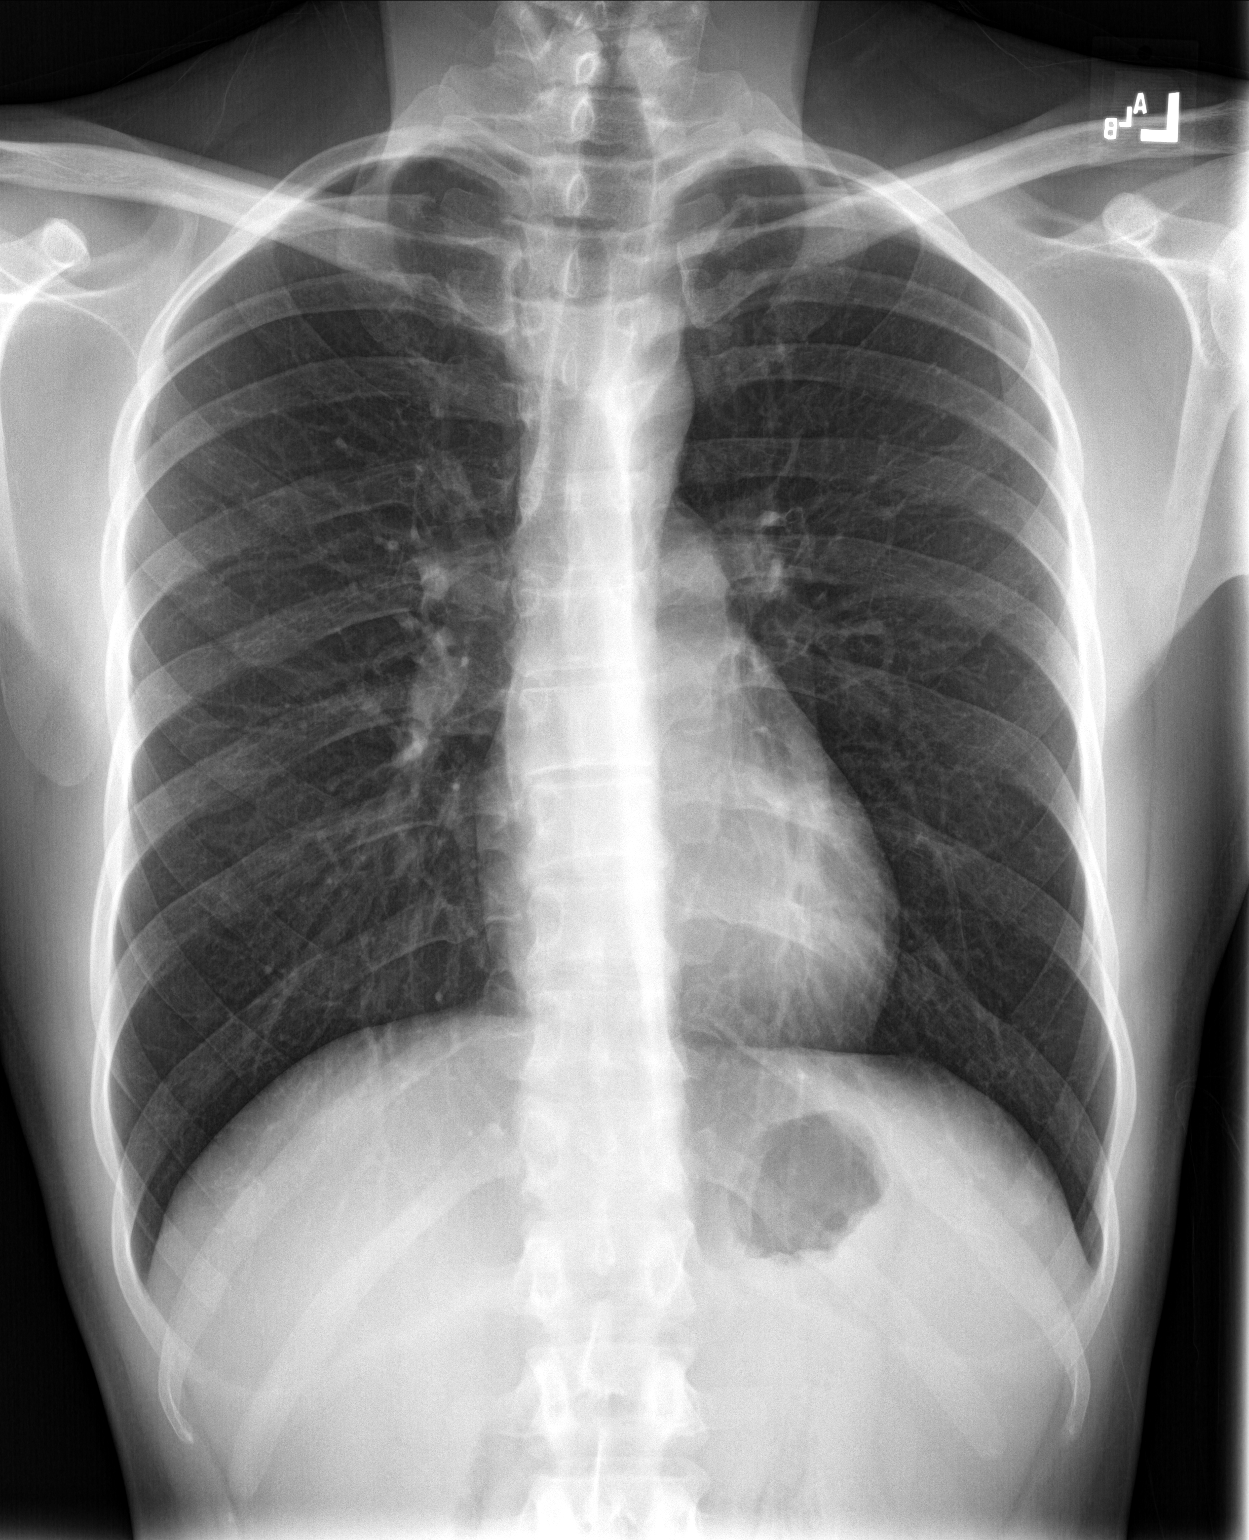

[chest lat]
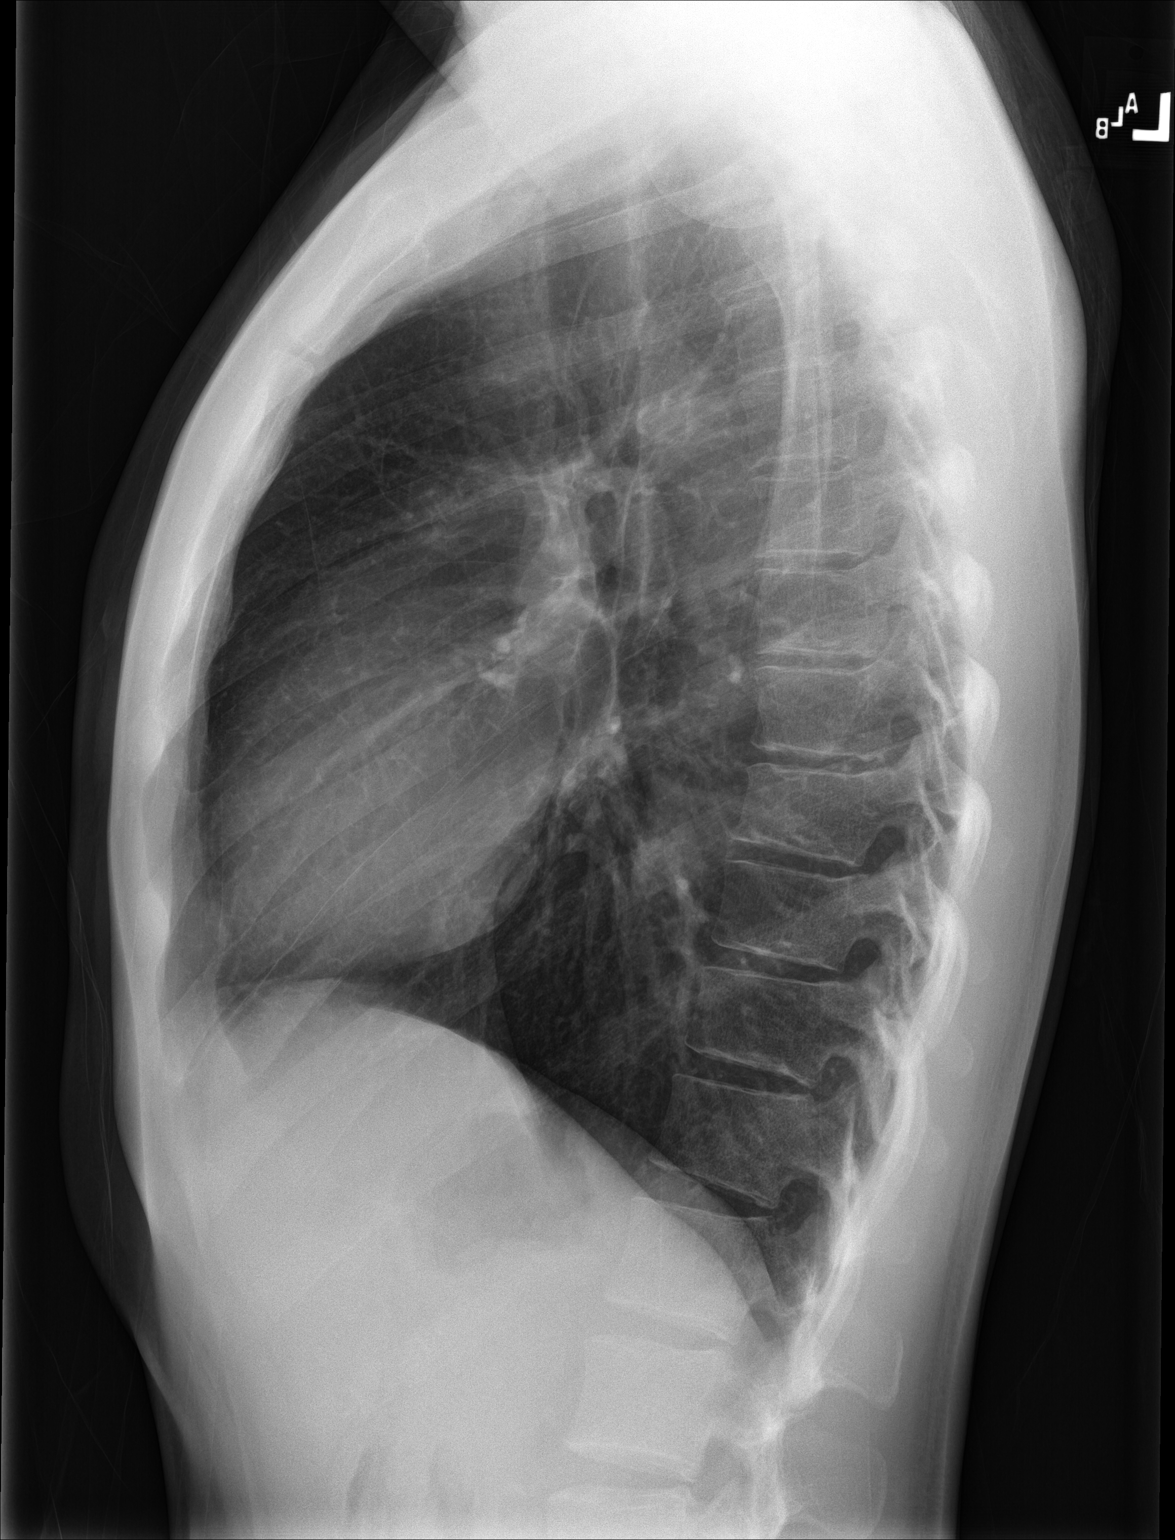

[2 of 2 positions shown; findings below may reference images not displayed]

FINDINGS: Patient rotated minimally left. Midline trachea. Normal heart size
and mediastinal contours.

Sharp costophrenic angles. No pneumothorax. Clear lungs. Minimal S
shaped thoracic spine curvature.
IMPRESSION: No active cardiopulmonary disease.

## 2021-01-17 ENCOUNTER — Other Ambulatory Visit: Payer: Self-pay

## 2021-01-17 ENCOUNTER — Ambulatory Visit
Admission: RE | Admit: 2021-01-17 | Discharge: 2021-01-17 | Disposition: A | Discharge status: Home / Self Care | Payer: 59 | Source: Ambulatory Visit | Attending: Physician Assistant | Admitting: Physician Assistant

## 2021-01-17 VITALS — BP 112/79 | HR 82 | Temp 98.4°F | Resp 16 | Ht 69.0 in | Wt 142.0 lb

## 2021-01-17 DIAGNOSIS — L255 Unspecified contact dermatitis due to plants, except food: Secondary | ICD-10-CM

## 2021-01-17 MED ORDER — PREDNISONE 10 MG PO TABS: ORAL_TABLET | ORAL | 0 refills | Status: AC

## 2021-01-17 MED ORDER — METHYLPREDNISOLONE SODIUM SUCC 125 MG IJ SOLR
125.0000 mg | Freq: Once | INTRAMUSCULAR | Status: AC
  Administered 2021-01-17: 125 mg via INTRAMUSCULAR

## 2021-01-17 NOTE — ED Triage Notes (Signed)
Patient c/o itchy rash on his face, legs, and and arms that started Thursday morning.  Patient has been taking Benadryl at home.

## 2021-01-17 NOTE — ED Provider Notes (Signed)
[facility]    CSN: [id number] Arrival date & time: 01/17/21  1247      History   Chief Complaint Chief Complaint  Patient presents with  . Rash    APPOINTMENT    HPI Michael Bowen is a 30 y.o. male presenting for diffuse rash of the face, neck, bilateral arms and legs as well as chest x4 days.  Patient states that he was cutting down a tree and thinks he came into contact with some sort of poisonous plant such as poison ivy or poison oak.  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  He has been applying calamine lotion and Benadryl without improvement in symptoms.  States that acute continues to spread.  He denies any severe symptom such as facial swelling, throat tightness or breathing difficulty.  Patient states that after IM and oral corticosteroids in the past because of his severe allergy.  He is interested in that today.  He has no other concerns.  HPI  Past Medical History:  Diagnosis Date  . ADHD   . Anxiety   . Bipolar 1 disorder [facility])     Patient Active Problem List   Diagnosis Date Noted  . Thrombocytopenia (HCC) 09/07/2020    Past Surgical History:  Procedure Laterality Date  . none         Home Medications    Prior to Admission medications   Medication Sig Start Date End Date Taking? Authorizing Provider  predniSONE (DELTASONE) 10 MG tablet Take 6 tabs po x 2 days, 5 tabs x 2 days, 4 tabs x 2 days, 3 tabs x 1 days, 2 tabs x 1 days, 1 tab x 1 day 01/17/21 01/26/21 Yes Shirlee Latch, PA-C    Family History Family History  Problem Relation Age of Onset  . Breast cancer Mother   . COPD Mother   . Bipolar disorder Father   . Diabetes Maternal Grandmother   . Diabetes Maternal Grandfather   . Stroke Paternal Grandfather   . Heart disease Paternal Grandfather   . Autism Nephew     Social History Social History   Tobacco Use  . Smoking status: Never Smoker  . Smokeless tobacco: Never Used  Vaping Use  . Vaping  Use: Never used  Substance Use Topics  . Alcohol use: Yes    Comment: occasional  . Drug use: Never     Allergies   Patient has no known allergies.   Review of Systems Review of Systems  Constitutional: Negative for fatigue and fever.  HENT: Negative for congestion, sinus pressure and sore throat.   Respiratory: Negative for cough and shortness of breath.   Gastrointestinal: Negative for nausea and vomiting.  Musculoskeletal: Negative for myalgias.  Skin: Positive for rash.  Neurological: Negative for weakness and headaches.  Hematological: Negative for adenopathy.     Physical Exam Triage Vital Signs ED Triage Vitals  Enc Vitals Group     BP 01/17/21 1259 112/79     Pulse Rate 01/17/21 1259 82     Resp 01/17/21 1259 16     Temp 01/17/21 1259 98.4 F (36.9 C)     Temp Source 01/17/21 1259 Oral     SpO2 01/17/21 1259 99 %     Weight 01/17/21 1258 142 lb (64.4 kg)     Height 01/17/21 1258 5\' 9"  (1.753 m)     Head Circumference --      Peak Flow --  Pain Score 01/17/21 1257 0     Pain Loc --      Pain Edu? --      Excl. in GC? --    No data found.  Updated Vital Signs BP 112/79 (BP Location: Left Arm)   Pulse 82   Temp 98.4 F (36.9 C) (Oral)   Resp 16   Ht 5\' 9"  (1.753 m)   Wt 142 lb (64.4 kg)   SpO2 99%   BMI 20.97 kg/m        Physical Exam Vitals and nursing note reviewed.  Constitutional:      General: He is not in acute distress.    Appearance: Normal appearance. He is well-developed. He is not ill-appearing.  HENT:     Head: Normocephalic and atraumatic.     Nose: Nose normal.     Mouth/Throat:     Mouth: Mucous membranes are moist.     Pharynx: Oropharynx is clear.  Eyes:     General: No scleral icterus.    Conjunctiva/sclera: Conjunctivae normal.  Cardiovascular:     Rate and Rhythm: Normal rate and regular rhythm.     Heart sounds: Normal heart sounds.  Pulmonary:     Effort: Pulmonary effort is normal. No respiratory distress.      Breath sounds: Normal breath sounds.  Musculoskeletal:     Cervical back: Neck supple.  Skin:    General: Skin is warm and dry.     Findings: Rash (Diffuse patches of vesicles on erythematous base of the right cheek, right side of the neck, upper chest, bilateral forearms and legs) present.  Neurological:     General: No focal deficit present.     Mental Status: He is alert. Mental status is at baseline.     Motor: No weakness.     Gait: Gait normal.  Psychiatric:        Mood and Affect: Mood normal.        Behavior: Behavior normal.        Thought Content: Thought content normal.      UC Treatments / Results  Labs (all labs ordered are listed, but only abnormal results are displayed) Labs Reviewed - No data to display  EKG   Radiology No results found.  Procedures Procedures (including critical care time)  Medications Ordered in UC Medications  methylPREDNISolone sodium succinate (SOLU-MEDROL) 125 mg/2 mL injection 125 mg (has no administration in time range)    Initial Impression / Assessment and Plan / UC Course  I have reviewed the triage vital signs and the nursing notes.  Pertinent labs & imaging results that were available during my care of the patient were reviewed by me and considered in my medical decision making (see chart for details).   30 year old male presenting for severe contact dermatitis, suspect poisonous plant.  Treating at this time with corticosteroids.  Patient given 125 IM Solu-Medrol in clinic and advised to start prednisone tomorrow.  Supportive care measures.  Advise follow-up for signs of infection or if reaction worsens.  ED precautions reviewed.   Final Clinical Impressions(s) / UC Diagnoses   Final diagnoses:  Contact dermatitis due to plants, except food, unspecified contact dermatitis type     Discharge Instructions     We have given you an injection of a corticosteroid for the rash since it is so widespread and worsening.   Start the prednisone tomorrow.  Try not to scratch.  Continue taking Benadryl as needed for itching.  Make sure  to keep the areas clean and dry.  Follow-up for any signs of infection or if you are not starting to improve over the next few days or for any worsening symptoms.  Go to the ED for any severe acute worsening of her symptoms.    ED Prescriptions    Medication Sig Dispense Auth. Provider   predniSONE (DELTASONE) 10 MG tablet Take 6 tabs po x 2 days, 5 tabs x 2 days, 4 tabs x 2 days, 3 tabs x 1 days, 2 tabs x 1 days, 1 tab x 1 day 36 tablet Shirlee Latch, PA-C     PDMP not reviewed this encounter.   Shirlee Latch, PA-C 01/17/21 1339

## 2021-01-17 NOTE — Discharge Instructions (Signed)
We have given you an injection of a corticosteroid for the rash since it is so widespread and worsening.  Start the prednisone tomorrow.  Try not to scratch.  Continue taking Benadryl as needed for itching.  Make sure to keep the areas clean and dry.  Follow-up for any signs of infection or if you are not starting to improve over the next few days or for any worsening symptoms.  Go to the ED for any severe acute worsening of her symptoms.

## 2021-05-28 ENCOUNTER — Encounter: Payer: Self-pay | Admitting: Internal Medicine

## 2021-05-28 ENCOUNTER — Ambulatory Visit (INDEPENDENT_AMBULATORY_CARE_PROVIDER_SITE_OTHER): Payer: 59 | Admitting: Internal Medicine

## 2021-05-28 ENCOUNTER — Other Ambulatory Visit: Payer: Self-pay

## 2021-05-28 VITALS — BP 104/68 | HR 82 | Temp 97.8°F | Ht 69.0 in | Wt 144.0 lb

## 2021-05-28 DIAGNOSIS — Z0000 Encounter for general adult medical examination without abnormal findings: Secondary | ICD-10-CM | POA: Diagnosis not present

## 2021-05-28 NOTE — Progress Notes (Signed)
Date:  05/28/2021   Name:  Michael Bowen   DOB:  1991/02/17   MRN:  [id number]   Chief Complaint: Annual Exam Michael Bowen is a 30 y.o. male who presents today for his Complete Annual Exam. He feels well. He reports exercising adult softball 2-3 times a week. He reports he is sleeping well.    Immunization History  Administered Date(s) Administered   Dtap, Unspecified 05/02/2014   PPD Test 08/29/2020   Tdap 04/22/2014    HPI  Lab Results  Component Value Date   CREATININE 0.91 08/29/2020   BUN 16 08/29/2020   NA 141 08/29/2020   K 4.3 08/29/2020   CL 106 08/29/2020   CO2 27 08/29/2020   No results found for: CHOL, HDL, LDLCALC, LDLDIRECT, TRIG, CHOLHDL No results found for: TSH No results found for: HGBA1C Lab Results  Component Value Date   WBC 7.3 09/09/2020   HGB 15.2 09/09/2020   HCT 43.7 09/09/2020   MCV 88.3 09/09/2020   PLT 158 09/09/2020   Lab Results  Component Value Date   ALT 19 08/29/2020   AST 17 08/29/2020   ALKPHOS 74 08/29/2020   BILITOT 0.8 08/29/2020     Review of Systems  Constitutional:  Negative for appetite change, chills, diaphoresis, fatigue and unexpected weight change.  HENT:  Positive for congestion. Negative for hearing loss, tinnitus, trouble swallowing and voice change.   Eyes:  Negative for visual disturbance.  Respiratory:  Negative for choking, shortness of breath and wheezing.   Cardiovascular:  Negative for chest pain, palpitations and leg swelling.  Gastrointestinal:  Negative for abdominal pain, blood in stool, constipation and diarrhea.  Genitourinary:  Negative for difficulty urinating, dysuria and frequency.  Musculoskeletal:  Negative for arthralgias, back pain and myalgias.  Skin:  Negative for color change and rash.  Allergic/Immunologic: Positive for environmental allergies.  Neurological:  Negative for dizziness, syncope and headaches.  Hematological:  Negative for adenopathy.  Psychiatric/Behavioral:   Negative for dysphoric mood and sleep disturbance. The patient is not nervous/anxious.    There are no problems to display for this patient.   No Known Allergies  Past Surgical History:  Procedure Laterality Date   none      Social History   Tobacco Use   Smoking status: Never   Smokeless tobacco: Never  Vaping Use   Vaping Use: Never used  Substance Use Topics   Alcohol use: Yes    Comment: occasional   Drug use: Never     Medication list has been reviewed and updated.  Current Meds  Medication Sig   cetirizine (ZYRTEC) 10 MG tablet Take 10 mg by mouth daily.    PHQ 2/9 Scores 05/28/2021 12/17/2019  PHQ - 2 Score 0 0  PHQ- 9 Score 1 0    GAD 7 : Generalized Anxiety Score 05/28/2021 12/17/2019  Nervous, Anxious, on Edge 0 1  Control/stop worrying 0 0  Worry too much - different things 0 0  Trouble relaxing 0 2  Restless 0 1  Easily annoyed or irritable 0 1  Afraid - awful might happen 0 0  Total GAD 7 Score 0 5  Anxiety Difficulty - Not difficult at all    BP Readings from Last 3 Encounters:  05/28/21 104/68  01/17/21 112/79  09/09/20 126/80    Physical Exam Vitals and nursing note reviewed.  Constitutional:      Appearance: Normal appearance. He is well-developed.  HENT:  Head: Normocephalic.     Right Ear: Tympanic membrane, ear canal and external ear normal.     Left Ear: Tympanic membrane, ear canal and external ear normal.     Nose: Nose normal.  Eyes:     Conjunctiva/sclera: Conjunctivae normal.     Pupils: Pupils are equal, round, and reactive to light.  Neck:     Thyroid: No thyromegaly.     Vascular: No carotid bruit.  Cardiovascular:     Rate and Rhythm: Normal rate and regular rhythm.     Pulses: Normal pulses.     Heart sounds: Normal heart sounds.  Pulmonary:     Effort: Pulmonary effort is normal.     Breath sounds: Normal breath sounds. No wheezing.  Chest:  Breasts:    Right: No mass.     Left: No mass.  Abdominal:      General: Abdomen is flat. Bowel sounds are normal.     Palpations: Abdomen is soft.     Tenderness: There is no abdominal tenderness.  Musculoskeletal:        General: Normal range of motion.     Cervical back: Normal range of motion and neck supple.     Right lower leg: No edema.     Left lower leg: No edema.  Lymphadenopathy:     Cervical: No cervical adenopathy.  Skin:    General: Skin is warm and dry.  Neurological:     General: No focal deficit present.     Mental Status: He is alert and oriented to person, place, and time.     Deep Tendon Reflexes: Reflexes are normal and symmetric.  Psychiatric:        Attention and Perception: Attention normal.        Mood and Affect: Mood normal.        Thought Content: Thought content normal.    Wt Readings from Last 3 Encounters:  05/28/21 144 lb (65.3 kg)  01/17/21 142 lb (64.4 kg)  09/09/20 139 lb (63 kg)    BP 104/68   Pulse 82   Temp 97.8 F (36.6 C) (Oral)   Ht 5\' 9"  (1.753 m)   Wt 144 lb (65.3 kg)   SpO2 98%   BMI 21.27 kg/m   Assessment and Plan: 1. Annual physical exam Normal exam; continue exercise, healthy diet Recent labs all normal. Lymphadenopathy resolved and he feels well He will send Covid vaccination dates Follow up one year   Partially dictated using Dragon software. Any errors are unintentional.  , MD [facility]  05/28/2021

## 2022-05-28 ENCOUNTER — Ambulatory Visit (INDEPENDENT_AMBULATORY_CARE_PROVIDER_SITE_OTHER): Payer: 59 | Admitting: Internal Medicine

## 2022-05-28 ENCOUNTER — Encounter: Payer: Self-pay | Admitting: Internal Medicine

## 2022-05-28 VITALS — BP 126/82 | HR 60 | Ht 69.0 in | Wt 150.0 lb

## 2022-05-28 DIAGNOSIS — Z862 Personal history of diseases of the blood and blood-forming organs and certain disorders involving the immune mechanism: Secondary | ICD-10-CM | POA: Diagnosis not present

## 2022-05-28 DIAGNOSIS — Z1159 Encounter for screening for other viral diseases: Secondary | ICD-10-CM

## 2022-05-28 DIAGNOSIS — F411 Generalized anxiety disorder: Secondary | ICD-10-CM

## 2022-05-28 DIAGNOSIS — Z13220 Encounter for screening for lipoid disorders: Secondary | ICD-10-CM | POA: Diagnosis not present

## 2022-05-28 DIAGNOSIS — Z0000 Encounter for general adult medical examination without abnormal findings: Secondary | ICD-10-CM | POA: Diagnosis not present

## 2022-05-28 MED ORDER — BUPROPION HCL ER (XL) 150 MG PO TB24: 150.0000 mg | ORAL_TABLET | Freq: Every day | ORAL | 1 refills | Status: DC

## 2022-05-28 NOTE — Progress Notes (Signed)
Date:  05/28/2022   Name:  Michael Bowen   DOB:  [birth year]   MRN:  [id number]   Chief Complaint: Annual Exam Michael Bowen is a 31 y.o. male who presents today for his Complete Annual Exam. He feels well. He reports exercising. He reports he is sleeping well.   Colonoscopy: none  Immunization History  Administered Date(s) Administered   Dtap, Unspecified 05/02/2014   PPD Test 08/29/2020   Tdap 04/22/2014   Health Maintenance Due  Topic Date Due   Hepatitis C Screening  Never done    No results found for: "PSA1", "PSA"   Anxiety Presents for initial visit. Episode onset: over one year. The problem has been gradually worsening. Symptoms include excessive worry, nervous/anxious behavior and obsessions. Patient reports no chest pain, dizziness, irritability, muscle tension, palpitations, shortness of breath or suicidal ideas. Symptoms occur most days. Nothing aggravates the symptoms. The quality of sleep is good. Nighttime awakenings: none.   Treatments tried: has talked with a friend who is a Veterinary surgeon. The treatment provided no relief.    Lab Results  Component Value Date   NA 141 08/29/2020   K 4.3 08/29/2020   CO2 27 08/29/2020   GLUCOSE 83 08/29/2020   BUN 16 08/29/2020   CREATININE 0.91 08/29/2020   CALCIUM 9.2 08/29/2020   GFRNONAA >60 08/29/2020   No results found for: "CHOL", "HDL", "LDLCALC", "LDLDIRECT", "TRIG", "CHOLHDL" No results found for: "TSH" No results found for: "HGBA1C" Lab Results  Component Value Date   WBC 7.3 09/09/2020   HGB 15.2 09/09/2020   HCT 43.7 09/09/2020   MCV 88.3 09/09/2020   PLT 158 09/09/2020   Lab Results  Component Value Date   ALT 19 08/29/2020   AST 17 08/29/2020   ALKPHOS 74 08/29/2020   BILITOT 0.8 08/29/2020   No results found for: "25OHVITD2", "25OHVITD3", "VD25OH"   Review of Systems  Constitutional:  Negative for appetite change, chills, diaphoresis, fatigue, irritability and unexpected weight change.   HENT:  Negative for hearing loss, tinnitus, trouble swallowing and voice change.   Eyes:  Negative for visual disturbance.  Respiratory:  Negative for choking, shortness of breath and wheezing.   Cardiovascular:  Negative for chest pain, palpitations and leg swelling.  Gastrointestinal:  Negative for abdominal pain, blood in stool, constipation and diarrhea.  Genitourinary:  Negative for difficulty urinating, dysuria and frequency.  Musculoskeletal:  Negative for arthralgias, back pain and myalgias.  Skin:  Negative for color change and rash.  Neurological:  Negative for dizziness, syncope and headaches.  Hematological:  Negative for adenopathy.  Psychiatric/Behavioral:  Negative for dysphoric mood, sleep disturbance and suicidal ideas. The patient is nervous/anxious.     There are no problems to display for this patient.   No Known Allergies  Past Surgical History:  Procedure Laterality Date   none      Social History   Tobacco Use   Smoking status: Never   Smokeless tobacco: Never  Vaping Use   Vaping Use: Never used  Substance Use Topics   Alcohol use: Yes    Comment: occasional   Drug use: Never     Medication list has been reviewed and updated.  Current Meds  Medication Sig   cetirizine (ZYRTEC) 10 MG tablet Take 10 mg by mouth daily.       05/28/2022    9:23 AM 05/28/2021    8:56 AM 12/17/2019    2:06 PM  GAD 7 : Generalized Anxiety Score  Nervous, Anxious, on Edge 2 0 1  Control/stop worrying 2 0 0  Worry too much - different things 2 0 0  Trouble relaxing 1 0 2  Restless 1 0 1  Easily annoyed or irritable 1 0 1  Afraid - awful might happen 2 0 0  Total GAD 7 Score 11 0 5  Anxiety Difficulty Somewhat difficult  Not difficult at all       05/28/2022    9:23 AM 05/28/2021    8:56 AM 12/17/2019    2:06 PM  Depression screen PHQ 2/9  Decreased Interest 1 0 0  Down, Depressed, Hopeless 1 0 0  PHQ - 2 Score 2 0 0  Altered sleeping 0 0 0  Tired,  decreased energy 1 1 0  Change in appetite 2 0 0  Feeling bad or failure about yourself  0 0 0  Trouble concentrating 1 0 0  Moving slowly or fidgety/restless 0 0 0  Suicidal thoughts 0 0 0  PHQ-9 Score 6 1 0  Difficult doing work/chores Not difficult at all Not difficult at all Not difficult at all    BP Readings from Last 3 Encounters:  05/28/22 126/82  05/28/21 104/68  01/17/21 112/79    Physical Exam Vitals and nursing note reviewed.  Constitutional:      Appearance: Normal appearance. He is well-developed.  HENT:     Head: Normocephalic.     Right Ear: Tympanic membrane, ear canal and external ear normal.     Left Ear: Tympanic membrane, ear canal and external ear normal.     Nose: Nose normal.  Eyes:     Conjunctiva/sclera: Conjunctivae normal.     Pupils: Pupils are equal, round, and reactive to light.  Neck:     Thyroid: No thyromegaly.     Vascular: No carotid bruit.  Cardiovascular:     Rate and Rhythm: Normal rate and regular rhythm.     Heart sounds: Normal heart sounds.  Pulmonary:     Effort: Pulmonary effort is normal.     Breath sounds: Normal breath sounds. No wheezing.  Chest:  Breasts:    Right: No mass.     Left: No mass.  Abdominal:     General: Bowel sounds are normal.     Palpations: Abdomen is soft.     Tenderness: There is no abdominal tenderness.  Musculoskeletal:        General: Normal range of motion.     Cervical back: Normal range of motion and neck supple.  Lymphadenopathy:     Cervical: No cervical adenopathy.  Skin:    General: Skin is warm and dry.     Capillary Refill: Capillary refill takes less than 2 seconds.  Neurological:     General: No focal deficit present.     Mental Status: He is alert and oriented to person, place, and time.     Deep Tendon Reflexes: Reflexes are normal and symmetric.  Psychiatric:        Attention and Perception: Attention normal.        Mood and Affect: Mood normal.        Thought Content:  Thought content normal.     Wt Readings from Last 3 Encounters:  05/28/22 150 lb (68 kg)  05/28/21 144 lb (65.3 kg)  01/17/21 142 lb (64.4 kg)    BP 126/82   Pulse 60   Ht 5\' 9"  (1.753 m)   Wt 150 lb (68 kg)   SpO2  97%   BMI 22.15 kg/m   Assessment and Plan: 1. Annual physical exam Normal exam. He declines immunizations - CBC with Differential/Platelet - Comprehensive metabolic panel - Lipid panel - Hepatitis C antibody - TSH  2. Screening for lipid disorders - Lipid panel  3. History of thrombocytopenia - CBC with Differential/Platelet  4. Need for hepatitis C screening test - Hepatitis C antibody  5. Generalized anxiety disorder Having more symptoms that are impacting his marriage.  He is agreeable to trying medication at this time. No SI/HI noted.  He has excellent insight and social support. Follow up in 6 weeks. - TSH - buPROPion (WELLBUTRIN XL) 150 MG 24 hr tablet; Take 1 tablet (150 mg total) by mouth daily.  Dispense: 30 tablet; Refill: 1   Partially dictated using Animal nutritionist. Any errors are unintentional.  Bari Edward, MD [facility]  05/28/2022

## 2022-05-29 LAB — COMPREHENSIVE METABOLIC PANEL
ALT: 31 IU/L (ref 0–44)
AST: 21 IU/L (ref 0–40)
Albumin/Globulin Ratio: 1.7 (ref 1.2–2.2)
Albumin: 4.5 g/dL (ref 4.3–5.2)
Alkaline Phosphatase: 100 IU/L (ref 44–121)
BUN/Creatinine Ratio: 15 (ref 9–20)
BUN: 13 mg/dL (ref 6–20)
Bilirubin Total: 0.6 mg/dL (ref 0.0–1.2)
CO2: 21 mmol/L (ref 20–29)
Calcium: 9.4 mg/dL (ref 8.7–10.2)
Chloride: 101 mmol/L (ref 96–106)
Creatinine, Ser: 0.85 mg/dL (ref 0.76–1.27)
Globulin, Total: 2.6 g/dL (ref 1.5–4.5)
Glucose: 78 mg/dL (ref 70–99)
Potassium: 4.3 mmol/L (ref 3.5–5.2)
Sodium: 141 mmol/L (ref 134–144)
Total Protein: 7.1 g/dL (ref 6.0–8.5)
eGFR: 121 mL/min/{1.73_m2} (ref >59)

## 2022-05-29 LAB — CBC WITH DIFFERENTIAL/PLATELET
Basophils Absolute: 0.1 x10E3/uL (ref 0.0–0.2)
Basos: 1 %
EOS (ABSOLUTE): 0.3 x10E3/uL (ref 0.0–0.4)
Eos: 4 %
Hematocrit: 41.3 % (ref 37.5–51.0)
Hemoglobin: 14.3 g/dL (ref 13.0–17.7)
Immature Grans (Abs): 0 x10E3/uL (ref 0.0–0.1)
Immature Granulocytes: 0 %
Lymphocytes Absolute: 1.9 x10E3/uL (ref 0.7–3.1)
Lymphs: 27 %
MCH: 31.3 pg (ref 26.6–33.0)
MCHC: 34.6 g/dL (ref 31.5–35.7)
MCV: 90 fL (ref 79–97)
Monocytes Absolute: 0.5 x10E3/uL (ref 0.1–0.9)
Monocytes: 8 %
Neutrophils Absolute: 4.4 x10E3/uL (ref 1.4–7.0)
Neutrophils: 60 %
Platelets: 278 x10E3/uL (ref 150–450)
RBC: 4.57 x10E6/uL (ref 4.14–5.80)
RDW: 11.4 % — ABNORMAL LOW (ref 11.6–15.4)
WBC: 7.2 x10E3/uL (ref 3.4–10.8)

## 2022-05-29 LAB — LIPID PANEL
Chol/HDL Ratio: 4.1 ratio (ref 0.0–5.0)
Cholesterol, Total: 142 mg/dL (ref 100–199)
HDL: 35 mg/dL — ABNORMAL LOW (ref >39)
LDL Chol Calc (NIH): 94 mg/dL (ref 0–99)
Triglycerides: 62 mg/dL (ref 0–149)
VLDL Cholesterol Cal: 13 mg/dL (ref 5–40)

## 2022-05-29 LAB — TSH: TSH: 0.721 u[IU]/mL (ref 0.450–4.500)

## 2022-05-29 LAB — HEPATITIS C ANTIBODY: Hep C Virus Ab: NONREACTIVE

## 2022-07-09 ENCOUNTER — Encounter: Payer: Self-pay | Admitting: Internal Medicine

## 2022-07-09 ENCOUNTER — Ambulatory Visit: Payer: 59 | Admitting: Internal Medicine

## 2022-07-09 VITALS — BP 106/72 | HR 74 | Ht 69.0 in | Wt 154.8 lb

## 2022-07-09 DIAGNOSIS — F411 Generalized anxiety disorder: Secondary | ICD-10-CM

## 2022-07-09 MED ORDER — ESCITALOPRAM OXALATE 10 MG PO TABS: 10.0000 mg | ORAL_TABLET | Freq: Every day | ORAL | 1 refills | Status: DC

## 2022-07-09 NOTE — Progress Notes (Signed)
Date:  07/09/2022   Name:  Michael Bowen   DOB:  01-18-1991   MRN:  [id number]   Chief Complaint: Anxiety  Anxiety Presents for follow-up visit. Symptoms include decreased concentration, depressed mood, excessive worry, insomnia, nervous/anxious behavior and restlessness. Patient reports no chest pain, nausea or shortness of breath. Symptoms occur most days. The quality of sleep is fair.   Compliance with medications is 76-100% (no benefit from Bupropion).    Lab Results  Component Value Date   NA 141 05/28/2022   K 4.3 05/28/2022   CO2 21 05/28/2022   GLUCOSE 78 05/28/2022   BUN 13 05/28/2022   CREATININE 0.85 05/28/2022   CALCIUM 9.4 05/28/2022   EGFR 121 05/28/2022   GFRNONAA >60 08/29/2020   Lab Results  Component Value Date   CHOL 142 05/28/2022   HDL 35 (L) 05/28/2022   LDLCALC 94 05/28/2022   TRIG 62 05/28/2022   CHOLHDL 4.1 05/28/2022   Lab Results  Component Value Date   TSH 0.721 05/28/2022   No results found for: "HGBA1C" Lab Results  Component Value Date   WBC 7.2 05/28/2022   HGB 14.3 05/28/2022   HCT 41.3 05/28/2022   MCV 90 05/28/2022   PLT 278 05/28/2022   Lab Results  Component Value Date   ALT 31 05/28/2022   AST 21 05/28/2022   ALKPHOS 100 05/28/2022   BILITOT 0.6 05/28/2022   No results found for: "25OHVITD2", "25OHVITD3", "VD25OH"   Review of Systems  Constitutional:  Negative for chills, fatigue and fever.  HENT:  Negative for trouble swallowing.   Respiratory:  Negative for chest tightness and shortness of breath.   Cardiovascular:  Negative for chest pain.  Gastrointestinal:  Negative for constipation, diarrhea and nausea.  Neurological:  Negative for headaches.  Psychiatric/Behavioral:  Positive for decreased concentration and sleep disturbance. The patient is nervous/anxious and has insomnia.     There are no problems to display for this patient.   No Known Allergies  Past Surgical History:  Procedure Laterality Date    none      Social History   Tobacco Use   Smoking status: Never   Smokeless tobacco: Never  Vaping Use   Vaping Use: Never used  Substance Use Topics   Alcohol use: Yes    Comment: occasional   Drug use: Never     Medication list has been reviewed and updated.  Current Meds  Medication Sig   buPROPion (WELLBUTRIN XL) 150 MG 24 hr tablet Take 1 tablet (150 mg total) by mouth daily.       07/09/2022    2:48 PM 05/28/2022    9:23 AM 05/28/2021    8:56 AM 12/17/2019    2:06 PM  GAD 7 : Generalized Anxiety Score  Nervous, Anxious, on Edge 2 2 0 1  Control/stop worrying 2 2 0 0  Worry too much - different things 2 2 0 0  Trouble relaxing 1 1 0 2  Restless 2 1 0 1  Easily annoyed or irritable 1 1 0 1  Afraid - awful might happen 2 2 0 0  Total GAD 7 Score 12 11 0 5  Anxiety Difficulty Not difficult at all Somewhat difficult  Not difficult at all       07/09/2022    2:48 PM 05/28/2022    9:23 AM 05/28/2021    8:56 AM  Depression screen PHQ 2/9  Decreased Interest 1 1 0  Down, Depressed, Hopeless 1  1 0  PHQ - 2 Score 2 2 0  Altered sleeping 2 0 0  Tired, decreased energy _0 Change in appetite 1 2 0  Feeling bad or failure about yourself  2 0 0  Trouble concentrating 1 1 0  Moving slowly or fidgety/restless 2 0 0  Suicidal thoughts 0 0 0  PHQ-9 Score _1 Difficult doing work/chores Not difficult at all Not difficult at all Not difficult at all    BP Readings from Last 3 Encounters:  07/09/22 106/72  05/28/22 126/82  05/28/21 104/68    Physical Exam Vitals and nursing note reviewed.  Constitutional:      General: He is not in acute distress.    Appearance: Normal appearance. He is well-developed.  HENT:     Head: Normocephalic and atraumatic.  Cardiovascular:     Rate and Rhythm: Normal rate and regular rhythm.     Heart sounds: No murmur heard. Pulmonary:     Effort: Pulmonary effort is normal. No respiratory distress.     Breath sounds: No  wheezing or rhonchi.  Musculoskeletal:        General: No swelling.     Cervical back: Normal range of motion.  Skin:    General: Skin is warm and dry.     Findings: No rash.  Neurological:     Mental Status: He is alert and oriented to person, place, and time.  Psychiatric:        Attention and Perception: Attention normal.        Mood and Affect: Mood normal.        Speech: Speech normal.        Behavior: Behavior normal.        Thought Content: Thought content does not include suicidal ideation. Thought content does not include suicidal plan.     Wt Readings from Last 3 Encounters:  07/09/22 154 lb 12.8 oz (70.2 kg)  05/28/22 150 lb (68 kg)  05/28/21 144 lb (65.3 kg)    BP 106/72 (BP Location: Right Arm, Patient Position: Sitting, Cuff Size: Normal)   Pulse 74   Ht _2  (1.753 m)   Wt 154 lb 12.8 oz (70.2 kg)   SpO2 97%   BMI 22.86 kg/m   Assessment and Plan: 1. Generalized anxiety disorder No response to Bupropion Discontinue bupropion and start Lexapro 5 mg daily x 4 days then 10 mg daily Follow up in 6 weeks Virtual visit - escitalopram (LEXAPRO) 10 MG tablet; Take 1 tablet (10 mg total) by mouth daily.  Dispense: 30 tablet; Refill: 1   Partially dictated using Editor, commissioning. Any errors are unintentional.  Halina Maidens, MD [facility]  07/09/2022

## 2022-08-12 ENCOUNTER — Encounter: Payer: Self-pay | Admitting: Internal Medicine

## 2022-08-12 NOTE — Telephone Encounter (Signed)
Please review.  KP

## 2022-08-20 ENCOUNTER — Encounter: Payer: Self-pay | Admitting: Internal Medicine

## 2022-08-20 ENCOUNTER — Telehealth: Payer: 59 | Admitting: Internal Medicine

## 2022-08-20 DIAGNOSIS — F411 Generalized anxiety disorder: Secondary | ICD-10-CM | POA: Diagnosis not present

## 2022-08-20 MED ORDER — BUPROPION HCL ER (XL) 150 MG PO TB24: 150.0000 mg | ORAL_TABLET | Freq: Every day | ORAL | 1 refills | Status: AC

## 2022-08-20 NOTE — Progress Notes (Signed)
Date:  08/20/2022   Name:  Michael Bowen   DOB:  09-18-1990   MRN:  [id number]  This encounter was conducted via video encounter due to patient preference and deemed appropriate by provider. The patient was correctly identified.  I advised that I am conducting the visit from a secure room in my office at [facility] clinic.  The patient is located work. The limitations of this form of encounter were discussed with the patient and he/she agreed to proceed.  Some vital signs will be absent.  Chief Complaint: Depression (Pt wants top stop lexapro and start back on lexapro due to side effects sexually half tablet still not helping )  Anxiety Presents for follow-up visit. Symptoms include depressed mood, excessive worry, nervous/anxious behavior and restlessness. Patient reports no chest pain, shortness of breath or suicidal ideas. Symptoms occur most days. The severity of symptoms is causing significant distress.   Compliance with medications: failed bupropion; started Lexapro last visit.  He had had some benefit from Lexapro but significant sexual dysfunction with delayed ejaculation. Cutting the dose to 5 mg did not help. He has been seeing a therapist who suggested that he try the Wellbutrin again now that his anxiety symptoms are improved.  He had felt that Wellbutrin was previously not helpful but then he learned that his family and coworkers actually thought it was beneficial.  Lab Results  Component Value Date   NA 141 05/28/2022   K 4.3 05/28/2022   CO2 21 05/28/2022   GLUCOSE 78 05/28/2022   BUN 13 05/28/2022   CREATININE 0.85 05/28/2022   CALCIUM 9.4 05/28/2022   EGFR 121 05/28/2022   GFRNONAA >60 08/29/2020   Lab Results  Component Value Date   CHOL 142 05/28/2022   HDL 35 (L) 05/28/2022   LDLCALC 94 05/28/2022   TRIG 62 05/28/2022   CHOLHDL 4.1 05/28/2022   Lab Results  Component Value Date   TSH 0.721 05/28/2022   No results found for: "HGBA1C" Lab Results   Component Value Date   WBC 7.2 05/28/2022   HGB 14.3 05/28/2022   HCT 41.3 05/28/2022   MCV 90 05/28/2022   PLT 278 05/28/2022   Lab Results  Component Value Date   ALT 31 05/28/2022   AST 21 05/28/2022   ALKPHOS 100 05/28/2022   BILITOT 0.6 05/28/2022   No results found for: "25OHVITD2", "25OHVITD3", "VD25OH"   Review of Systems  Constitutional:  Positive for fatigue. Negative for chills, fever and unexpected weight change.  Respiratory:  Negative for chest tightness and shortness of breath.   Cardiovascular:  Negative for chest pain.  Genitourinary:        Delayed ejaculation  Psychiatric/Behavioral:  Negative for suicidal ideas. The patient is nervous/anxious.     Patient Active Problem List   Diagnosis Date Noted   Generalized anxiety disorder 07/09/2022    No Known Allergies  Past Surgical History:  Procedure Laterality Date   none      Social History   Tobacco Use   Smoking status: Never   Smokeless tobacco: Never  Vaping Use   Vaping Use: Never used  Substance Use Topics   Alcohol use: Yes    Comment: occasional   Drug use: Never     Medication list has been reviewed and updated.  Current Meds  Medication Sig   escitalopram (LEXAPRO) 10 MG tablet Take 1 tablet (10 mg total) by mouth daily.       08/20/2022  11:27 AM 07/09/2022    2:48 PM 05/28/2022    9:23 AM 05/28/2021    8:56 AM  GAD 7 : Generalized Anxiety Score  Nervous, Anxious, on Edge _0 0  Control/stop worrying _1 0  Worry too much - different things _2 0  Trouble relaxing 0 1 1 0  Restless 0 2 1 0  Easily annoyed or irritable _3 0  Afraid - awful might happen _4 0  Total GAD 7 Score _5 0  Anxiety Difficulty Not difficult at all Not difficult at all Somewhat difficult        08/20/2022   11:26 AM 07/09/2022    2:48 PM 05/28/2022    9:23 AM  Depression screen PHQ 2/9  Decreased Interest 0 1 1  Down, Depressed, Hopeless 0 1 1  PHQ - 2 Score 0 2 2   Altered sleeping 0 2 0  Tired, decreased energy _6 Change in appetite 0 1 2  Feeling bad or failure about yourself  0 2 0  Trouble concentrating 0 1 1  Moving slowly or fidgety/restless 2 2 0  Suicidal thoughts 0 0 0  PHQ-9 Score _7 Difficult doing work/chores Not difficult at all Not difficult at all Not difficult at all    BP Readings from Last 3 Encounters:  07/09/22 106/72  05/28/22 126/82  05/28/21 104/68    Physical Exam Vitals and nursing note reviewed.  Constitutional:      General: He is not in acute distress.    Appearance: He is well-developed.  HENT:     Head: Normocephalic and atraumatic.  Pulmonary:     Effort: Pulmonary effort is normal. No respiratory distress.  Skin:    Findings: No rash.  Neurological:     Mental Status: He is alert and oriented to person, place, and time.  Psychiatric:        Mood and Affect: Mood normal.        Behavior: Behavior normal.     Wt Readings from Last 3 Encounters:  07/09/22 154 lb 12.8 oz (70.2 kg)  05/28/22 150 lb (68 kg)  05/28/21 144 lb (65.3 kg)    Ht _8  (1.753 m)   BMI 22.86 kg/m   Assessment and Plan: 1. Generalized anxiety disorder Improved with SSRI but intolerable side effects. Will stop Lexapro and resume bupropion. Follow up if needed, otherwise call for med refill when needed. - buPROPion (WELLBUTRIN XL) 150 MG 24 hr tablet; Take 1 tablet (150 mg total) by mouth daily.  Dispense: 30 tablet; Refill: 1  I spent 6 minutes on the encounter, 100% by video.  Partially dictated using Editor, commissioning. Any errors are unintentional.  Halina Maidens, MD [facility]  08/20/2022

## 2022-09-21 ENCOUNTER — Encounter: Payer: Self-pay | Admitting: Internal Medicine

## 2022-09-21 ENCOUNTER — Ambulatory Visit: Payer: 59 | Admitting: Family Medicine

## 2022-09-21 ENCOUNTER — Encounter: Payer: Self-pay | Admitting: Family Medicine

## 2022-09-21 VITALS — BP 138/80 | HR 110 | Ht 69.0 in | Wt 155.0 lb

## 2022-09-21 DIAGNOSIS — R52 Pain, unspecified: Secondary | ICD-10-CM | POA: Diagnosis not present

## 2022-09-21 DIAGNOSIS — J111 Influenza due to unidentified influenza virus with other respiratory manifestations: Secondary | ICD-10-CM | POA: Diagnosis not present

## 2022-09-21 LAB — POCT INFLUENZA A/B
Influenza A, POC: NEGATIVE
Influenza B, POC: NEGATIVE

## 2022-09-21 LAB — POC COVID19 BINAXNOW: SARS Coronavirus 2 Ag: NEGATIVE

## 2022-09-21 MED ORDER — OSELTAMIVIR PHOSPHATE 75 MG PO CAPS: 75.0000 mg | ORAL_CAPSULE | Freq: Two times a day (BID) | ORAL | 0 refills | Status: AC

## 2022-09-21 MED ORDER — ONDANSETRON HCL 4 MG PO TABS: 4.0000 mg | ORAL_TABLET | Freq: Three times a day (TID) | ORAL | 0 refills | Status: AC | PRN

## 2022-09-21 NOTE — Progress Notes (Signed)
     Primary Care / Sports Medicine Office Visit  Patient Information:  Patient ID: Michael Bowen, male DOB: 1991-05-08 Age: 32 y.o. MRN: [id number]   Michael Bowen is a pleasant 32 y.o. male presenting with the following:  Chief Complaint  Patient presents with   Generalized Body Aches    Yesterday, cough,  fever?,     Vitals:   09/21/22 1305  BP: 138/80  Pulse: (!) 110  SpO2: 97%   Vitals:   09/21/22 1305  Weight: 155 lb (70.3 kg)  Height: 5\' 9"  (1.753 m)   Body mass index is 22.89 kg/m.  No results found.   Independent interpretation of notes and tests performed by another provider:   None  Procedures performed:   None  Pertinent History, Exam, Impression, and Recommendations:   Michael Bowen was seen today for generalized body aches.  Flu Assessment & Plan: Acute 1 day history of fevers, severe myalgias, cough productive of yellowish sputum, fatigue, GI upset, nausea, and mucus-laden diarrhea.  Examination with ill-appearing individual resting supine, HEENT significant for mucus surrounding swollen turbinates, erythematous oropharynx without exudate, tympanic membranes and canals benign, no lymphadenopathy, clear air entry throughout all lung fields, benign cardiac sounds.  We did perform point-of-care COVID and flu testing which came back negative however given his acute severe presentation influenza still considered high send differential, will send in Tamiflu, Zofran as needed, supportive care advised, out of work for the remaining of the week via work note.   Generalized body aches -     POCT Influenza A/B -     POC COVID-19 BinaxNow  Other orders -     Oseltamivir Phosphate; Take 1 capsule (75 mg total) by mouth 2 (two) times daily.  Dispense: 10 capsule; Refill: 0 -     Ondansetron HCl; Take 1 tablet (4 mg total) by mouth every 8 (eight) hours as needed for nausea or vomiting.  Dispense: 20 tablet; Refill: 0     Orders & Medications Meds ordered this  encounter  Medications   oseltamivir (TAMIFLU) 75 MG capsule    Sig: Take 1 capsule (75 mg total) by mouth 2 (two) times daily.    Dispense:  10 capsule    Refill:  0   ondansetron (ZOFRAN) 4 MG tablet    Sig: Take 1 tablet (4 mg total) by mouth every 8 (eight) hours as needed for nausea or vomiting.    Dispense:  20 tablet    Refill:  0   Orders Placed This Encounter  Procedures   POCT Influenza A/B   POC COVID-19     No follow-ups on file.     Montel Culver, MD, [facility]   Primary Care Sports Medicine Primary Care and Sports Medicine at [facility]

## 2022-09-21 NOTE — Patient Instructions (Signed)
-  Take Tamiflu for full course - Can use Zofran as needed - Review information attached for supportive care (symptom control) - Remain out of work for the rest of the week, can return to work sooner if symptoms permit and without fever - Follow-up as needed

## 2022-09-21 NOTE — Assessment & Plan Note (Signed)
Acute 1 day history of fevers, severe myalgias, cough productive of yellowish sputum, fatigue, GI upset, nausea, and mucus-laden diarrhea.  Examination with ill-appearing individual resting supine, HEENT significant for mucus surrounding swollen turbinates, erythematous oropharynx without exudate, tympanic membranes and canals benign, no lymphadenopathy, clear air entry throughout all lung fields, benign cardiac sounds.  We did perform point-of-care COVID and flu testing which came back negative however given his acute severe presentation influenza still considered high send differential, will send in Tamiflu, Zofran as needed, supportive care advised, out of work for the remaining of the week via work note.
